# Patient Record
Sex: Female | Born: 1985
Health system: Southern US, Community
[De-identification: ages and names within clinical notes are randomized; demographics above are authoritative.]

## PROBLEM LIST (undated history)

## (undated) DIAGNOSIS — Z8669 Personal history of other diseases of the nervous system and sense organs: Secondary | ICD-10-CM

## (undated) DIAGNOSIS — J45909 Unspecified asthma, uncomplicated: Secondary | ICD-10-CM

## (undated) DIAGNOSIS — L709 Acne, unspecified: Secondary | ICD-10-CM

## (undated) HISTORY — DX: Acne, unspecified: L70.9

## (undated) HISTORY — DX: Unspecified asthma, uncomplicated: J45.909

## (undated) HISTORY — DX: Personal history of other diseases of the nervous system and sense organs: Z86.69

---
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
## 2012-07-15 ENCOUNTER — Ambulatory Visit (INDEPENDENT_AMBULATORY_CARE_PROVIDER_SITE_OTHER): Payer: BC Managed Care – PPO | Admitting: Family Medicine

## 2012-07-15 VITALS — BP 141/88 | HR 84 | Temp 97.5°F | Resp 20 | Ht 62.0 in | Wt 180.0 lb

## 2012-07-15 DIAGNOSIS — J40 Bronchitis, not specified as acute or chronic: Secondary | ICD-10-CM

## 2012-07-15 MED ORDER — BENZONATATE 100 MG PO CAPS: ORAL_CAPSULE | ORAL | Status: DC

## 2012-07-15 MED ORDER — LEVOFLOXACIN 500 MG PO TABS: 500.0000 mg | ORAL_TABLET | Freq: Every day | ORAL | Status: DC

## 2012-07-15 MED ORDER — HYDROCODONE-HOMATROPINE 5-1.5 MG/5ML PO SYRP: 5.0000 mL | ORAL_SOLUTION | ORAL | Status: DC | PRN

## 2012-07-15 NOTE — Progress Notes (Signed)
Subjective: Patient has had a cold and cough for 3 weeks. I will better worse again. She was in [place] but is here with her parents. Has not been running any fevers most of the time, though when they had a 99.5 temperature. She's not coughing up a lot of phlegm. She does get a fair number of these infections.  Objective: This TMs are normal. Throat clear. Neck neck supple without nodes. Chest clear. Heart regular without murmurs. And soft the mass or tenderness.  Assessment bronchitis  Plan: She says Zithromax upsets her stomach. A prescribed Levaquin since she is allergic to penicillins and sulfa.

## 2012-07-15 NOTE — Patient Instructions (Signed)
Drink plenty of fluids. Try to get sufficient rest.  Use medications as directed.  If worse return for a recheck.

## 2013-12-15 ENCOUNTER — Ambulatory Visit (INDEPENDENT_AMBULATORY_CARE_PROVIDER_SITE_OTHER): Payer: BC Managed Care – PPO | Admitting: Emergency Medicine

## 2013-12-15 VITALS — BP 114/82 | HR 96 | Temp 98.1°F | Resp 18 | Ht 62.0 in | Wt 170.0 lb

## 2013-12-15 DIAGNOSIS — J018 Other acute sinusitis: Secondary | ICD-10-CM

## 2013-12-15 DIAGNOSIS — J209 Acute bronchitis, unspecified: Secondary | ICD-10-CM

## 2013-12-15 MED ORDER — PROMETHAZINE-CODEINE 6.25-10 MG/5ML PO SYRP: 5.0000 mL | ORAL_SOLUTION | Freq: Four times a day (QID) | ORAL | Status: DC | PRN

## 2013-12-15 MED ORDER — AMOXICILLIN-POT CLAVULANATE 875-125 MG PO TABS: 1.0000 | ORAL_TABLET | Freq: Two times a day (BID) | ORAL | Status: DC

## 2013-12-15 MED ORDER — PSEUDOEPHEDRINE-GUAIFENESIN ER 60-600 MG PO TB12: 1.0000 | ORAL_TABLET | Freq: Two times a day (BID) | ORAL | Status: DC

## 2013-12-15 NOTE — Patient Instructions (Signed)

## 2013-12-15 NOTE — Progress Notes (Signed)
[facility] [address], [place] [id number] [phone]  Date:  12/15/2013   Name:  Joy Rampnna C Banales   DOB:  [birth year]   MRN:  [id number]  PCP:  Default, Provider, MD    Chief Complaint: Cough, Sore Throat and Nasal Congestion   History of Present Illness:  Joy Boyer is a 28 y.o. very pleasant female patient who presents with the following:  Ill for three weeks.  Has nasal congestion and mucopurulent drainage with a sore throat and post nasal drainage.  Temp to 101.  Has a cough with mucopurulent sputum and occasional wheezing. No stool change no rash or nausea or vomiting.  Symptoms have waxed and waned.  Now worse again.  No ill contacts.  No improvement with over the counter medications or other home remedies. Denies other complaint or health concern today.   There are no active problems to display for this patient.   Past Medical History  Diagnosis Date  . Acne   . Hx of migraines   . Asthma     History reviewed. No pertinent past surgical history.  History  Substance Use Topics  . Smoking status: Never Smoker   . Smokeless tobacco: Not on file  . Alcohol Use: Not on file    Family History  Problem Relation Age of Onset  . Hyperlipidemia Mother   . Hypertension Mother   . Mental illness Father   . Cancer Father   . Diabetes Father   . Heart disease Father   . Cancer Maternal Aunt   . Mental illness Paternal Uncle   . Heart disease Paternal Uncle   . Mental retardation Maternal Grandmother   . COPD Paternal Grandmother   . Cancer Paternal Grandfather     Allergies  Allergen Reactions  . Penicillins Rash    Arms and chest.  . Sulfa Antibiotics Other (See Comments)    Behavior change.  Became agitated and angry.    Medication list has been reviewed and updated.  Current Outpatient Prescriptions on File Prior to Visit  Medication Sig Dispense Refill  . benzonatate (TESSALON) 100 MG capsule Use 1-2 tablets 3 times daily as  necessary for cough. May be used with other cough medicines if needed.  30 capsule  0  . HYDROcodone-homatropine (HYCODAN) 5-1.5 MG/5ML syrup Take 5 mLs by mouth every 4 (four) hours as needed for cough.  120 mL  0  . levofloxacin (LEVAQUIN) 500 MG tablet Take 1 tablet (500 mg total) by mouth daily.  7 tablet  0   No current facility-administered medications on file prior to visit.    Review of Systems:  As per HPI, otherwise negative.    Physical Examination: Filed Vitals:   12/15/13 1414  BP: 114/82  Pulse: 96  Temp: 98.1 F (36.7 C)  Resp: 18   Filed Vitals:   12/15/13 1414  Height: 5\' 2"  (1.575 m)  Weight: 170 lb (77.111 kg)   Body mass index is 31.09 kg/(m^2). Ideal Body Weight: Weight in (lb) to have BMI = 25: 136.4  GEN: WDWN, NAD, Non-toxic, A & O x 3 HEENT: Atraumatic, Normocephalic. Neck supple. No masses, No LAD. Ears and Nose: No external deformity. CV: RRR, No M/G/R. No JVD. No thrill. No extra heart sounds. PULM: CTA B, no wheezes, crackles, rhonchi. No retractions. No resp. distress. No accessory muscle use. ABD: S, NT, ND, +BS. No rebound. No HSM. EXTR: No c/c/e NEURO Normal gait.  PSYCH: Normally  interactive. Conversant. Not depressed or anxious appearing.  Calm demeanor.    Assessment and Plan: Sinusitis Bronchitis mucinex  Phen c cod augmentin  Signed,  Phillips Odor, MD

## 2013-12-19 ENCOUNTER — Telehealth: Payer: Self-pay

## 2013-12-19 NOTE — Telephone Encounter (Signed)
Pt is still not feeling any better from office visit on Saturday and she feels the medication is not helping and would like to know what to do next

## 2013-12-19 NOTE — Telephone Encounter (Signed)
Cough not any better. Cough meds not helping. Nasal congestion is worse. Advised pt to either wait out the abx to see if it turns the corner or if she is getting worse to RTC. Pt agreeable.

## 2013-12-26 ENCOUNTER — Ambulatory Visit (INDEPENDENT_AMBULATORY_CARE_PROVIDER_SITE_OTHER): Payer: BC Managed Care – PPO | Admitting: Family Medicine

## 2013-12-26 VITALS — BP 122/74 | HR 98 | Temp 98.0°F | Resp 17 | Ht 60.0 in | Wt 168.0 lb

## 2013-12-26 DIAGNOSIS — J45909 Unspecified asthma, uncomplicated: Secondary | ICD-10-CM

## 2013-12-26 DIAGNOSIS — J069 Acute upper respiratory infection, unspecified: Secondary | ICD-10-CM

## 2013-12-26 DIAGNOSIS — R059 Cough, unspecified: Secondary | ICD-10-CM

## 2013-12-26 DIAGNOSIS — J40 Bronchitis, not specified as acute or chronic: Secondary | ICD-10-CM

## 2013-12-26 DIAGNOSIS — R05 Cough: Secondary | ICD-10-CM

## 2013-12-26 MED ORDER — FLUTICASONE-SALMETEROL 100-50 MCG/DOSE IN AEPB: 1.0000 | INHALATION_SPRAY | Freq: Two times a day (BID) | RESPIRATORY_TRACT | Status: DC | PRN

## 2013-12-26 MED ORDER — BENZONATATE 100 MG PO CAPS: ORAL_CAPSULE | ORAL | Status: DC

## 2013-12-26 MED ORDER — ALBUTEROL SULFATE HFA 108 (90 BASE) MCG/ACT IN AERS: 2.0000 | INHALATION_SPRAY | Freq: Four times a day (QID) | RESPIRATORY_TRACT | Status: DC | PRN

## 2013-12-26 MED ORDER — HYDROCOD POLST-CHLORPHEN POLST 10-8 MG/5ML PO LQCR: 5.0000 mL | Freq: Two times a day (BID) | ORAL | Status: DC | PRN

## 2013-12-26 NOTE — Progress Notes (Signed)
Chief Complaint:  Chief Complaint  Patient presents with  . Cough  . Chest Pain    Rib     HPI: Joy Boyer is a 28 y.o. female who is here for a 5 week history of persistent cough. She was seen here 2 weeks ago and was put on Augmentin for sinusitis and bronchitis. SHe feels better but cough is persistent. She feels like she deos when she has reactive airway disease and ends up on steroids. She does not feel like that yet but she feels itis almost getting to that point due to the cough. She has it throughout the day and last night she had minimal wheexing. She has a h/o asthm and is allergic to dust and mites but not to pollen. She denies SOB but ay have wheezed a little after coughing spells.  +ashtma but is more consistent with exercise induced asthma now then it used to be when she was younger.  No fevers chills.   Biostatistician  Past Medical History  Diagnosis Date  . Acne   . Hx of migraines   . Asthma    No past surgical history on file. History   Social History  . Marital Status: Single    Spouse Name: N/A    Number of Children: N/A  . Years of Education: N/A   Social History Main Topics  . Smoking status: Never Smoker   . Smokeless tobacco: None  . Alcohol Use: None  . Drug Use: None  . Sexual Activity: No   Other Topics Concern  . None   Social History Narrative  . None   Family History  Problem Relation Age of Onset  . Hyperlipidemia Mother   . Hypertension Mother   . Mental illness Father   . Cancer Father   . Diabetes Father   . Heart disease Father   . Cancer Maternal Aunt   . Mental illness Paternal Uncle   . Heart disease Paternal Uncle   . Mental retardation Maternal Grandmother   . COPD Paternal Grandmother   . Cancer Paternal Grandfather    Allergies  Allergen Reactions  . Penicillins Rash    Arms and chest.  . Sulfa Antibiotics Other (See Comments)    Behavior change.  Became agitated and angry.   Prior to Admission  medications   Medication Sig Start Date End Date Taking? Authorizing Provider  promethazine-codeine (PHENERGAN WITH CODEINE) 6.25-10 MG/5ML syrup Take 5-10 mLs by mouth every 6 (six) hours as needed. 12/15/13  Yes Phillips OdorJeffery Anderson, MD     ROS: The patient denies fevers, chills, night sweats, unintentional weight loss, chest pain, palpitations, dyspnea on exertion, nausea, vomiting, abdominal pain, dysuria, hematuria, melena, numbness, weakness, or tingling.   All other systems have been reviewed and were otherwise negative with the exception of those mentioned in the HPI and as above.    PHYSICAL EXAM: Filed Vitals:   12/26/13 1209  BP: 122/74  Pulse: 98  Temp: 98 F (36.7 C)  Resp: 17  SPo2 100% Filed Vitals:   12/26/13 1209  Height: 5' (1.524 m)  Weight: 168 lb (76.204 kg)   Body mass index is 32.81 kg/(m^2).  General: Alert, no acute distress HEENT:  Normocephalic, atraumatic, oropharynx patent. EOMI, PERRLA. TM nl, no exudates.nontender sinuses, minimal erthem nares Cardiovascular:  Regular rate and rhythm, no rubs murmurs or gallops.  No Carotid bruits, radial pulse intact. No pedal edema.  Respiratory: Clear to auscultation bilaterally.  No wheezes,  rales, or rhonchi.  No cyanosis, no use of accessory musculature GI: No organomegaly, abdomen is soft and non-tender, positive bowel sounds.  No masses. Skin: No rashes. Neurologic: Facial musculature symmetric. Psychiatric: Patient is appropriate throughout our interaction. Lymphatic: No cervical lymphadenopathy Musculoskeletal: Gait intact.   LABS: No results found for this or any previous visit.   EKG/XRAY:   Primary read interpreted [facility].   ASSESSMENT/PLAN: Encounter Diagnoses  Name Primary?  . Cough Yes  . Reactive airway disease without complication   . URI, acute   . Bronchitis, not specified as acute or chronic    Most likely post viral /sinusitis PND cough causing some reactive airway sxs in  patient with h.o asthma She currently has good airflow, deneis any wheecing , SOB/DOE Rx advair, albuterol  Rx Tussionex, tessalon perles. , flonase No  abx for now, see how she does,if no sxs consider f/u   Gross sideeffects, risk and benefits, and alternatives of medications d/w patient. Patient is aware that all medications have potential sideeffects and we are unable to predict every sideeffect or drug-drug interaction that may occur.  Lenell Antuhao P Leota Maka, DO 12/26/2013 5:36 PM

## 2013-12-26 NOTE — Patient Instructions (Signed)
Bronchitis  Bronchitis is inflammation of the airways that extend from the windpipe into the lungs (bronchi). The inflammation often causes mucus to develop, which leads to a cough. If the inflammation becomes severe, it may cause shortness of breath.  CAUSES   Bronchitis may be caused by:    Viral infections.    Bacteria.    Cigarette smoke.    Allergens, pollutants, and other irritants.   SIGNS AND SYMPTOMS   The most common symptom of bronchitis is a frequent cough that produces mucus. Other symptoms include:   Fever.    Body aches.    Chest congestion.    Chills.    Shortness of breath.    Sore throat.   DIAGNOSIS   Bronchitis is usually diagnosed through a medical history and physical exam. Tests, such as chest X-rays, are sometimes done to rule out other conditions.   TREATMENT   You may need to avoid contact with whatever caused the problem (smoking, for example). Medicines are sometimes needed. These may include:   Antibiotics. These may be prescribed if the condition is caused by bacteria.   Cough suppressants. These may be prescribed for relief of cough symptoms.    Inhaled medicines. These may be prescribed to help open your airways and make it easier for you to breathe.    Steroid medicines. These may be prescribed for those with recurrent (chronic) bronchitis.  HOME CARE INSTRUCTIONS   Get plenty of rest.    Drink enough fluids to keep your urine clear or pale yellow (unless you have a medical condition that requires fluid restriction). Increasing fluids may help thin your secretions and will prevent dehydration.    Only take over-the-counter or prescription medicines as directed by your health care provider.   Only take antibiotics as directed. Make sure you finish them even if you start to feel better.   Avoid secondhand smoke, irritating chemicals, and strong fumes. These will make bronchitis worse. If you are a smoker, quit smoking. Consider using nicotine gum or  skin patches to help control withdrawal symptoms. Quitting smoking will help your lungs heal faster.    Put a cool-mist humidifier in your bedroom at night to moisten the air. This may help loosen mucus. Change the water in the humidifier daily. You can also run the hot water in your shower and sit in the bathroom with the door closed for 5 10 minutes.    Follow up with your health care provider as directed.    Wash your hands frequently to avoid catching bronchitis again or spreading an infection to others.   SEEK MEDICAL CARE IF:  Your symptoms do not improve after 1 week of treatment.   SEEK IMMEDIATE MEDICAL CARE IF:   Your fever increases.   You have chills.    You have chest pain.    You have worsening shortness of breath.    You have bloody sputum.   You faint.   You have lightheadedness.   You have a severe headache.    You vomit repeatedly.  MAKE SURE YOU:    Understand these instructions.   Will watch your condition.   Will get help right away if you are not doing well or get worse.  Document Released: 08/23/2005 Document Revised: 06/13/2013 Document Reviewed: 04/17/2013  [employer] Patient Information 2014 [employer].

## 2014-03-07 ENCOUNTER — Ambulatory Visit (INDEPENDENT_AMBULATORY_CARE_PROVIDER_SITE_OTHER): Payer: BC Managed Care – PPO | Admitting: Family Medicine

## 2014-03-07 VITALS — BP 104/76 | HR 76 | Temp 97.6°F | Resp 18 | Ht 62.0 in | Wt 174.2 lb

## 2014-03-07 DIAGNOSIS — H65199 Other acute nonsuppurative otitis media, unspecified ear: Secondary | ICD-10-CM

## 2014-03-07 DIAGNOSIS — H65194 Other acute nonsuppurative otitis media, recurrent, right ear: Secondary | ICD-10-CM

## 2014-03-07 MED ORDER — CEFDINIR 300 MG PO CAPS: 300.0000 mg | ORAL_CAPSULE | Freq: Two times a day (BID) | ORAL | Status: DC

## 2014-03-07 NOTE — Progress Notes (Signed)
   Subjective:    Patient ID: Joy Boyer, female    DOB: 11-12-1985, 28 y.o.   MRN: [id number]  HPI Patient reports right ear pain x 2 days. Has history of OM several months ago that required several antibiotics and ENT referral. She was treated with amoxil, augmentin and finally cefdinir with relief. With last round of antibiotics, she was also put on floxin otic because her TM could not be visualized and ruptured TM could not be excluded. She reports the pain feels the same as her previous OM.  Has history of asthma related to URI, not currently on any maintenance medication, has not needed albuterol for months.   Review of Systems No runny nose, no nasal congestion, no sore throat, no cough, no fever.    Objective:   Physical Exam  Vitals reviewed. Constitutional: She is oriented to person, place, and time. She appears well-developed and well-nourished.  HENT:  Head: Normocephalic and atraumatic.  Right Ear: External ear normal. Right ear drainage: moderate amount wax, few drops of reddish substance, ? cerumen vs. blood although TM intact. Tympanic membrane is injected and erythematous.  Left Ear: Tympanic membrane, external ear and ear canal normal.  Nose: Nose normal.  Mouth/Throat: Oropharynx is clear and moist and mucous membranes are normal.  No mastoid tenderness.   Eyes: Conjunctivae are normal. Right eye exhibits no discharge. Left eye exhibits no discharge. No scleral icterus.  Neck: Normal range of motion. Neck supple.  Musculoskeletal: Normal range of motion.  Neurological: She is alert and oriented to person, place, and time.  Skin: Skin is warm and dry.  Psychiatric: She has a normal mood and affect. Her behavior is normal. Judgment and thought content normal.      Assessment & Plan:  1. 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  Dispense: 20  capsule; Refill: 0 -if no improvement in 48-72 hours, can start floxin otic- she has some at home. -she is going to follow up with ENT  -can take ibuprofen for pain -RTC prn Emi Belfasteborah B. Drea Jurewicz, FNP-BC  [facility]  03/07/2014 3:30 PM

## 2014-05-17 ENCOUNTER — Ambulatory Visit (INDEPENDENT_AMBULATORY_CARE_PROVIDER_SITE_OTHER): Payer: BC Managed Care – PPO | Admitting: Family Medicine

## 2014-05-17 VITALS — BP 108/76 | HR 91 | Temp 97.8°F | Resp 18 | Ht 62.0 in | Wt 174.0 lb

## 2014-05-17 DIAGNOSIS — R300 Dysuria: Secondary | ICD-10-CM

## 2014-05-17 DIAGNOSIS — R109 Unspecified abdominal pain: Secondary | ICD-10-CM

## 2014-05-17 DIAGNOSIS — R1030 Lower abdominal pain, unspecified: Secondary | ICD-10-CM

## 2014-05-17 DIAGNOSIS — R350 Frequency of micturition: Secondary | ICD-10-CM

## 2014-05-17 LAB — POCT UA - MICROSCOPIC ONLY
CASTS, UR, LPF, POC: NEGATIVE
CRYSTALS, UR, HPF, POC: NEGATIVE
Mucus, UA: NEGATIVE
YEAST UA: NEGATIVE

## 2014-05-17 LAB — POCT WET PREP WITH KOH
CLUE CELLS WET PREP PER HPF POC: NEGATIVE
KOH Prep POC: NEGATIVE
RBC Wet Prep HPF POC: NEGATIVE
TRICHOMONAS UA: NEGATIVE
Yeast Wet Prep HPF POC: NEGATIVE

## 2014-05-17 LAB — POCT URINALYSIS DIPSTICK
Bilirubin, UA: NEGATIVE
GLUCOSE UA: NEGATIVE
Ketones, UA: NEGATIVE
Leukocytes, UA: NEGATIVE
NITRITE UA: NEGATIVE
PH UA: 5.5
PROTEIN UA: NEGATIVE
SPEC GRAV UA: 1.015
UROBILINOGEN UA: 0.2

## 2014-05-17 MED ORDER — PHENAZOPYRIDINE HCL 200 MG PO TABS: 200.0000 mg | ORAL_TABLET | Freq: Three times a day (TID) | ORAL | Status: DC | PRN

## 2014-05-17 NOTE — Progress Notes (Signed)
Subjective:    Patient ID: Joy Boyer, female    DOB: [birth year], 28 y.o.   MRN: [id number]  HPI Patient presents today with 2 days of urinary burning, frequency, lower abdominal pain. She has not had frequent cystitis in the past. Last episode several years ago. She is not sexually active.    Review of Systems Low grade fever (99) two days ago, chills today, no nausea/vomiting, some decreased appetite, no back pain. She has some vaginal discharge, but states this is normal for her.     Objective:   Physical Exam  Vitals reviewed. Constitutional: She is oriented to person, place, and time. She appears well-developed and well-nourished.  HENT:  Head: Normocephalic.  Eyes: Conjunctivae are normal.  Neck: Normal range of motion. Neck supple.  Cardiovascular: Normal rate and regular rhythm.   Pulmonary/Chest: Effort normal and breath sounds normal.  Abdominal: Soft. Bowel sounds are normal. There is tenderness in the suprapubic area. There is no CVA tenderness.  Musculoskeletal: Normal range of motion.  Neurological: She is alert and oriented to person, place, and time.  Skin: Skin is warm and dry.  Psychiatric: She has a normal mood and affect. Her behavior is normal. Judgment and thought content normal.   Results for orders placed in visit on 05/17/14  POCT URINALYSIS DIPSTICK      Result Value Ref Range   Color, UA yellow     Clarity, UA clear     Glucose, UA neg     Bilirubin, UA neg     Ketones, UA neg     Spec Grav, UA 1.015     Blood, UA tr-intact     pH, UA 5.5     Protein, UA neg     Urobilinogen, UA 0.2     Nitrite, UA neg     Leukocytes, UA Negative    POCT UA - MICROSCOPIC ONLY      Result Value Ref Range   WBC, Ur, HPF, POC 0-6     RBC, urine, microscopic 0-3     Bacteria, U Microscopic trace     Mucus, UA neg     Epithelial cells, urine per micros 1-5     Crystals, Ur, HPF, POC neg     Casts, Ur, LPF, POC neg     Yeast, UA neg    POCT WET PREP WITH  KOH      Result Value Ref Range   Trichomonas, UA Negative     Clue Cells Wet Prep HPF POC neg     Epithelial Wet Prep HPF POC 4-10     Yeast Wet Prep HPF POC neg     Bacteria Wet Prep HPF POC 2+     RBC Wet Prep HPF POC neg     WBC Wet Prep HPF POC 1-5     KOH Prep POC Negative        Assessment & Plan:  1. Urinary frequency - POCT urinalysis dipstick - POCT UA - Microscopic Only - POCT Wet Prep with KOH  2. Lower abdominal pain - POCT Wet Prep with KOH  3. Dysuria -Provided written and verbal information regarding diagnosis and treatment. -urinalysis and wet prep normal, will treat dysuria and push fluids. - phenazopyridine (PYRIDIUM) 200 MG tablet; Take 1 tablet (200 mg total) by mouth 3 (three) times daily as needed for pain.  Dispense: 10 tablet; Refill: 0 -RTC if no resolution of symptoms in 3-4 days or sooner if worsening.   Gavin Pound  Shea Stakes, FNP-BC  [facility], [facility]  05/19/2014 8:14 PM

## 2014-06-09 ENCOUNTER — Ambulatory Visit (INDEPENDENT_AMBULATORY_CARE_PROVIDER_SITE_OTHER): Payer: BC Managed Care – PPO | Admitting: Internal Medicine

## 2014-06-09 ENCOUNTER — Ambulatory Visit (INDEPENDENT_AMBULATORY_CARE_PROVIDER_SITE_OTHER): Payer: BC Managed Care – PPO

## 2014-06-09 VITALS — BP 128/84 | HR 80 | Temp 97.3°F | Resp 16 | Ht 63.0 in | Wt 175.0 lb

## 2014-06-09 DIAGNOSIS — R300 Dysuria: Secondary | ICD-10-CM

## 2014-06-09 DIAGNOSIS — R1032 Left lower quadrant pain: Secondary | ICD-10-CM

## 2014-06-09 LAB — POCT URINALYSIS DIPSTICK
Blood, UA: NEGATIVE
Glucose, UA: NEGATIVE
KETONES UA: 15
LEUKOCYTES UA: NEGATIVE
NITRITE UA: NEGATIVE
PH UA: 5.5
Spec Grav, UA: >=1.03
Urobilinogen, UA: 0.2

## 2014-06-09 LAB — POCT UA - MICROSCOPIC ONLY
CASTS, UR, LPF, POC: NEGATIVE
Crystals, Ur, HPF, POC: NEGATIVE
Mucus, UA: POSITIVE
WBC, Ur, HPF, POC: NEGATIVE
YEAST UA: NEGATIVE

## 2014-06-09 MED ORDER — CIPROFLOXACIN HCL 250 MG PO TABS: 250.0000 mg | ORAL_TABLET | Freq: Two times a day (BID) | ORAL | Status: DC

## 2014-06-09 NOTE — Progress Notes (Addendum)
Subjective:    Patient ID: Joy Boyer, female    DOB: 03/07/1986, 28 y.o.   MRN: [id number] This chart was scribed for Robert P. Merla Riches, MD by Chestine Spore, ED Scribe. The patient was seen in room 11 at 3:11 PM.   Chief Complaint  Patient presents with  . Dysuria    HPI Joy Boyer is a 28 y.o. female who presents today complaining of dysuria. She states that her symptoms are different from last time that she was seen for it--see our chart hx. She states that she took Keflex for it and after 4 days, it was still present and she was given Rx for Ciprox 3 days with no [facility] She states that she thought that it was a yeast infection so she used Monistat OTC with no relief. She states that she will occassionally get better with Abx. She states that she took Align probiotic this last time with the Abx as well.   She states that she feels cramping in her lower abdomen. She states that she has a burning feeling when she pees and when she doesn't. She states that her period was 2 weeks ago and she is on birth control she states that she did not have different feeling of cramps. She states that in the last three months, she had not had trouble with starting her stream. She states that her urine does not cut off while she is peeing. Has no hx sexual activityShe states that she is having associated symptoms of abdominal pain, vaginal discharge, nausea, and back pain. She denies fever, constipation, diarrhea, nocturia, night sweats, frequency, and any other associated symptoms. She denies ever having an ovarian cyst. She denies being sick in any other way in the last 2-3 months. She states that in te last 6 months-1 year, she had two ear infections, and respiratory issues that were controlled with inhaled steroids.   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 She states that she is not stressed with her new job.    There are no active problems to  display for this patient.  Past Medical History  Diagnosis Date  . Acne   . Hx of migraines   . Asthma    History reviewed. No pertinent past surgical history. Allergies  Allergen Reactions  . Penicillins Rash    Arms and chest.  . Sulfa Antibiotics Other (See Comments)    Behavior change.  Became agitated and angry.   Prior to Admission medications   Medication Sig Start Date End Date Taking? Authorizing Provider  albuterol (PROVENTIL HFA;VENTOLIN HFA) 108 (90 BASE) MCG/ACT inhaler Inhale 2 puffs into the lungs every 6 (six) hours as needed for wheezing or shortness of breath. 12/26/13  Yes Thao P Le, DO  norethindrone-ethinyl estradiol (OVCON-35,BALZIVA,BRIELLYN) 0.4-35 MG-MCG tablet Take 1 tablet by mouth daily.   Yes Historical Provider, MD  phenazopyridine (PYRIDIUM) 200 MG tablet Take 1 tablet (200 mg total) by mouth 3 (three) times daily as needed for pain. 05/17/14   Emi Belfast, FNP      Review of Systems  Constitutional: Positive for diaphoresis. Negative for fever and chills.  HENT: Negative for dental problem.   Gastrointestinal: Positive for abdominal pain. Negative for diarrhea and constipation.  Genitourinary: Positive for dysuria and vaginal discharge. Negative for frequency.       No nocturia  Musculoskeletal: Positive for back pain.  Neurological: Negative for headaches.  Objective:   Physical Exam  Nursing note and vitals reviewed. Constitutional: She is oriented to person, place, and time. She appears well-developed and well-nourished. No distress.  HENT:  Head: Normocephalic and atraumatic.  Eyes: EOM are normal.  Neck: Neck supple. No tracheal deviation present.  Cardiovascular: Normal rate.   Pulmonary/Chest: Effort normal. No respiratory distress.  Abdominal: Bowel sounds are normal. She exhibits no distension and no mass. There is tenderness (LLQ to palpation). There is no rebound and no guarding.  No CVA tenderness to percussion    Musculoskeletal: Normal range of motion.  Neurological: She is alert and oriented to person, place, and time.  Skin: Skin is warm and dry.  Psychiatric: She has a normal mood and affect. Her behavior is normal.   [facility] reading (PRIMARY) by  Dr. Doolitt;le=NAD but lots of stool  Results for orders placed in visit on 06/09/14  POCT URINALYSIS DIPSTICK      Result Value Ref Range   Color, UA dark yellow     Clarity, UA clear     Glucose, UA neg     Bilirubin, UA small     Ketones, UA 15     Spec Grav, UA >=1.030     Blood, UA neg     pH, UA 5.5     Protein, UA trace     Urobilinogen, UA 0.2     Nitrite, UA neg     Leukocytes, UA Negative    POCT UA - MICROSCOPIC ONLY      Result Value Ref Range   WBC, Ur, HPF, POC neg     RBC, urine, microscopic 0-2     Bacteria, U Microscopic trace     Mucus, UA pos     Epithelial cells, urine per micros 5-7     Crystals, Ur, HPF, POC neg     Casts, Ur, LPF, POC neg     Yeast, UA neg           BP 128/84  Pulse 80  Temp(Src) 97.3 F (36.3 C) (Oral)  Resp 16  Ht 5\' 3"  (1.6 m)  Wt 175 lb (79.379 kg)  BMI 31.01 kg/m2  SpO2 100%  LMP 05/28/2014  Assessment & Plan:  COORDINATION OF CARE: 3:12 PM-Discussed treatment plan which includes miralax and cipro with pt at bedside and pt agreed to plan.   I personally performed the services described in this documentation, which was scribed in my presence. The recorded information has been reviewed and is accurate.    Dysuria - Plan: POCT urinalysis dipstick, POCT UA - Microscopic Only, Urine culture  Abdominal pain, LLQ - Plan: DG Abd 1 View  Unclear dx--possible relapse of incompletely treated UTI but past labs did not include a culture Cramping could be GI so will start miralax. Dysuria without freq and no local skin lesions=?? Will culture urine and start cipro with change in treatment dictated by labs and course

## 2014-06-09 NOTE — Patient Instructions (Signed)
miralax

## 2014-06-11 LAB — URINE CULTURE
Colony Count: NO GROWTH
ORGANISM ID, BACTERIA: NO GROWTH

## 2014-06-15 ENCOUNTER — Telehealth: Payer: Self-pay | Admitting: Internal Medicine

## 2014-06-15 NOTE — Telephone Encounter (Signed)
Disc neg cult--she's better, not well If 10d rx does not clear this then urol eval next

## 2014-09-30 ENCOUNTER — Ambulatory Visit (INDEPENDENT_AMBULATORY_CARE_PROVIDER_SITE_OTHER): Payer: BLUE CROSS/BLUE SHIELD | Admitting: Family Medicine

## 2014-09-30 VITALS — BP 124/88 | HR 121 | Temp 98.4°F | Resp 20 | Ht 62.0 in | Wt 179.0 lb

## 2014-09-30 DIAGNOSIS — J45909 Unspecified asthma, uncomplicated: Secondary | ICD-10-CM

## 2014-09-30 DIAGNOSIS — J209 Acute bronchitis, unspecified: Secondary | ICD-10-CM

## 2014-09-30 MED ORDER — HYDROCOD POLST-CHLORPHEN POLST 10-8 MG/5ML PO LQCR: 5.0000 mL | Freq: Two times a day (BID) | ORAL | Status: DC | PRN

## 2014-09-30 MED ORDER — PREDNISONE 20 MG PO TABS: ORAL_TABLET | ORAL | Status: DC

## 2014-09-30 MED ORDER — LEVOFLOXACIN 500 MG PO TABS: 500.0000 mg | ORAL_TABLET | Freq: Every day | ORAL | Status: DC

## 2014-09-30 MED ORDER — ALBUTEROL SULFATE HFA 108 (90 BASE) MCG/ACT IN AERS: 2.0000 | INHALATION_SPRAY | Freq: Four times a day (QID) | RESPIRATORY_TRACT | Status: DC | PRN

## 2014-09-30 NOTE — Progress Notes (Signed)
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 Over the weekend she developed a fever and cough became worse. She's not having shortness of breath but the cough is keeping her awake.  Patient's had no hemoptysis, extreme shortness of breath or chest pain  Objective: HEENT: Unremarkable Neck: Supple no adenopathy Chest: Clear with a fe intermittent rhonchi Heart: Regular no murmur Extremities: No rash or edema     ICD-9-CM ICD-10-CM   1. Acute bronchitis, unspecified organism 466.0 J20.9 levofloxacin (LEVAQUIN) 500 MG tablet     predniSONE (DELTASONE) 20 MG tablet     chlorpheniramine-HYDROcodone (TUSSIONEX PENNKINETIC ER) 10-8 MG/5ML LQCR  2. Reactive airway disease without complication 493.90 J45.909 albuterol (PROVENTIL HFA;VENTOLIN HFA) 108 (90 BASE) MCG/ACT inhaler     levofloxacin (LEVAQUIN) 500 MG tablet     predniSONE (DELTASONE) 20 MG tablet     chlorpheniramine-HYDROcodone (TUSSIONEX PENNKINETIC ER) 10-8 MG/5ML LQCR     Signed, Elvina SidleKurt Rayaan Garguilo, MD

## 2014-09-30 NOTE — Patient Instructions (Signed)

## 2015-03-15 ENCOUNTER — Ambulatory Visit (INDEPENDENT_AMBULATORY_CARE_PROVIDER_SITE_OTHER): Payer: BLUE CROSS/BLUE SHIELD | Admitting: Family Medicine

## 2015-03-15 VITALS — BP 128/64 | HR 96 | Temp 97.6°F | Resp 16 | Ht 62.0 in | Wt 189.8 lb

## 2015-03-15 DIAGNOSIS — H9202 Otalgia, left ear: Secondary | ICD-10-CM

## 2015-03-15 DIAGNOSIS — H60392 Other infective otitis externa, left ear: Secondary | ICD-10-CM

## 2015-03-15 MED ORDER — NEOMYCIN-POLYMYXIN-HC 3.5-10000-1 OT SOLN: 3.0000 [drp] | Freq: Four times a day (QID) | OTIC | Status: DC

## 2015-03-15 NOTE — Progress Notes (Signed)
[facility] [address], [place] [id number] [phone]  Date:  03/15/2015   Name:  Joy Boyer   DOB:  Nov 08, 1985   MRN:  [id number]  PCP:  Default, Provider, MD    Chief Complaint: Ear Pain   History of Present Illness:  Joy Goltznna Stcyr is a 29 y.o. very pleasant female patient who presents with the following:  Generally healthy established pt here today with left ear pain- the right is a little unusual feeling She has had a few ear infection recetnly so she came in early.   She noted the discomfort yesterday and last night No other sx except for baseline allergies which are normal for her She had a lot of ear infection as a child, never had tubes.  However her ears did ok until more recently  She has a little bit of heraring loss in the left only, no tinnitus.  No drainage from her ears    There are no active problems to display for this patient.   Past Medical History  Diagnosis Date  . Acne   . Hx of migraines   . Asthma     History reviewed. No pertinent past surgical history.  History  Substance Use Topics  . Smoking status: Never Smoker   . Smokeless tobacco: Not on file  . Alcohol Use: Not on file    Family History  Problem Relation Age of Onset  . Hyperlipidemia Mother   . Hypertension Mother   . Mental illness Father   . Cancer Father   . Diabetes Father   . Heart disease Father   . Cancer Maternal Aunt   . Mental illness Paternal Uncle   . Heart disease Paternal Uncle   . Mental retardation Maternal Grandmother   . COPD Paternal Grandmother   . Cancer Paternal Grandfather     Allergies  Allergen Reactions  . Penicillins Rash    Arms and chest.  . Sulfa Antibiotics Other (See Comments)    Behavior change.  Became agitated and angry.    Medication list has been reviewed and updated.  Current Outpatient Prescriptions on File Prior to Visit  Medication Sig Dispense Refill  . albuterol (PROVENTIL HFA;VENTOLIN HFA)  108 (90 BASE) MCG/ACT inhaler Inhale 2 puffs into the lungs every 6 (six) hours as needed for wheezing or shortness of breath. 1 Inhaler 3  . norethindrone-ethinyl estradiol (OVCON-35,BALZIVA,BRIELLYN) 0.4-35 MG-MCG tablet Take 1 tablet by mouth daily.     No current facility-administered medications on file prior to visit.    Review of Systems:  As per HPI- otherwise negative.   Physical Examination: Filed Vitals:   03/15/15 1024  BP: 128/64  Pulse: 117  Temp: 97.6 F (36.4 C)  Resp: 16   Filed Vitals:   03/15/15 1024  Height: 5\' 2"  (1.575 m)  Weight: 189 lb 12.8 oz (86.093 kg)   Body mass index is 34.71 kg/(m^2). Ideal Body Weight: Weight in (lb) to have BMI = 25: 136.4  GEN: WDWN, NAD, Non-toxic, A & O x , overweight, looks well HEENT: Atraumatic, Normocephalic. Neck supple. No masses, No LAD.  Bilateral TM wnl, oropharynx normal.  PEERL,EOMI.   Left external canal with erythema and slight debris, no swelling.  Tenderness with movement of pinna Ears and Nose: No external deformity. CV: RRR, No M/G/R. No JVD. No thrill. No extra heart sounds. PULM: CTA B, no wheezes, crackles, rhonchi. No retractions. No resp. distress. No accessory muscle use. EXTR: No  c/c/e NEURO Normal gait.  PSYCH: Normally interactive. Conversant. Not depressed or anxious appearing.  Calm demeanor.    Assessment and Plan: Otitis, externa, infective, left - Plan: neomycin-polymyxin-hydrocortisone (CORTISPORIN) otic solution  Ear pain, left  Treat as above for OE.  Follow-up of not better soon  Signed Abbe Amsterdam, MD

## 2015-03-15 NOTE — Patient Instructions (Signed)
You have otitis externa, or an ear canal infection Use the drop 4x a day for 7- 10 days Certainly also ok to use ibuprofen and/ or tylenol as needed Let me know if not better in the next couple of days!

## 2015-03-16 ENCOUNTER — Telehealth: Payer: Self-pay

## 2015-03-16 NOTE — Telephone Encounter (Signed)
Patient calling back to see if an antibiotic will be called in today. I informed her Dr Patsy Lageropland was not in the office. She stated she was in a lot of discomfort and needed something today. Patients call back [phone] and she uses [facility] at [address]

## 2015-03-16 NOTE — Telephone Encounter (Signed)
Patient states her left ear is not any better. Per she was told by Dr copland that she would call in oral antibiotic if her ear didn't improve. [facility] [address] and her call back number is [phone]

## 2015-03-17 ENCOUNTER — Other Ambulatory Visit: Payer: Self-pay | Admitting: Emergency Medicine

## 2015-03-17 MED ORDER — CLINDAMYCIN HCL 150 MG PO CAPS: 150.0000 mg | ORAL_CAPSULE | Freq: Three times a day (TID) | ORAL | Status: DC

## 2015-03-17 NOTE — Telephone Encounter (Signed)
Spoke to Dr. Dareen PianoAnderson and he sent in Clindamycin for pt. Informed pt abx has been sent in.

## 2015-07-13 ENCOUNTER — Ambulatory Visit (INDEPENDENT_AMBULATORY_CARE_PROVIDER_SITE_OTHER): Payer: BLUE CROSS/BLUE SHIELD | Admitting: Physician Assistant

## 2015-07-13 VITALS — BP 140/80 | HR 101 | Temp 97.7°F | Resp 18 | Ht 62.0 in | Wt 196.4 lb

## 2015-07-13 DIAGNOSIS — H6091 Unspecified otitis externa, right ear: Secondary | ICD-10-CM | POA: Diagnosis not present

## 2015-07-13 DIAGNOSIS — M26609 Unspecified temporomandibular joint disorder, unspecified side: Secondary | ICD-10-CM

## 2015-07-13 MED ORDER — CLINDAMYCIN HCL 150 MG PO CAPS: 150.0000 mg | ORAL_CAPSULE | Freq: Three times a day (TID) | ORAL | Status: DC

## 2015-07-13 MED ORDER — MELOXICAM 15 MG PO TABS: 15.0000 mg | ORAL_TABLET | Freq: Every day | ORAL | Status: DC

## 2015-07-13 MED ORDER — CIPROFLOXACIN-HYDROCORTISONE 0.2-1 % OT SUSP: 3.0000 [drp] | Freq: Two times a day (BID) | OTIC | Status: DC

## 2015-07-13 NOTE — Patient Instructions (Signed)
Resume the heating pad, soft foods, etc. If your symptoms persist, please follow-up with ENT.

## 2015-07-13 NOTE — Progress Notes (Signed)
Patient ID: Joy Boyer, female    DOB: 01/27/1986, 29 y.o.   MRN: [id number]  PCP: Pcp Not In System  Subjective:   Chief Complaint  Patient presents with  . OTHER    Poss tmj, 10 days ago    HPI Presents for evaluation of jaw pain x 10 days.   Saw ENT in July after multiple ear infections primarily on the right side, at which time a diagnosis of TMJ was made. Tried a 2.5-week trial of high-dose ibuprofen, soft foods, and heat, which did help until now.   Jaw pain has been worse on the right side over the last week, but yesterday it was particularly painful. Associated with mild swelling and difficulty eating any type of food. She took 2 ibuprofen q 4 hours with only mild temporary relief and nausea.   Patient denies any clicking of popping of the jaw. The right ear is tender all over, but she denies any discharge, fullness, or tinnitus. She endorses slightly muffled hearing, but attributes this to her jaw swelling.   Review of Systems Constitutional: Negative for fever and chills.  HENT: Positive for ear pain (right ear tenderness) and hearing loss (slightly muffled hearing on the right side). Negative for congestion, ear discharge, postnasal drip, rhinorrhea, sinus pressure, sore throat and tinnitus.  Gastrointestinal: Positive for nausea (related to ibuprofen consumption). Negative for vomiting.  Musculoskeletal: Positive for joint swelling (right jaw swelling) and arthralgias (right jaw pain).      Patient Active Problem List   Diagnosis Date Noted  . 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 Authorizing Provider  albuterol (PROVENTIL HFA;VENTOLIN HFA) 108 (90 BASE) MCG/ACT inhaler Inhale 2 puffs into the lungs every 6 (six) hours as needed for wheezing or shortness of breath. 09/30/14  Yes Elvina SidleKurt Lauenstein, MD  loratadine (CLARITIN) 10 MG tablet Take 10 mg by mouth daily.   Yes Historical Provider, MD    norethindrone-ethinyl estradiol (OVCON-35,BALZIVA,BRIELLYN) 0.4-35 MG-MCG tablet Take 1 tablet by mouth daily.   Yes Historical Provider, MD     Allergies  Allergen Reactions  . Penicillins Rash    Arms and chest.  . Sulfa Antibiotics Other (See Comments)    Behavior change.  Became agitated and angry.       Objective:  Physical Exam  Constitutional: She is oriented to person, place, and time. She appears well-developed and well-nourished. She is active and cooperative. No distress.  BP 140/80 mmHg  Pulse 101  Temp(Src) 97.7 F (36.5 C) (Oral)  Resp 18  Ht 5\' 2"  (1.575 m)  Wt 196 lb 6.4 oz (89.086 kg)  BMI 35.91 kg/m2  SpO2 99%  LMP 07/04/2015   HENT:  Head: Normocephalic and atraumatic.  Right Ear: There is swelling and tenderness.  Left Ear: Hearing, tympanic membrane, external ear and ear canal normal.  Nose: Nose normal.  Mouth/Throat: Uvula is midline, oropharynx is clear and moist and mucous membranes are normal. Normal dentition. No uvula swelling.  RIGHT external ear is swollen, and mildly erythematous. It is tender on palpation. The canal is swollen with moist, soft cerumen and visualization of the TM is obscured. Unable to open her mouth wide enough to insert a 3-fingered fist due to pain. Tenderness of the RIGHT TMJ.  Eyes: Conjunctivae are normal.  Neck: Normal range of motion, full passive range of motion without pain and phonation normal. Neck supple.  Pulmonary/Chest: Effort normal.  Neurological:  She is alert and oriented to person, place, and time.  Psychiatric: She has a normal mood and affect. Her speech is normal and behavior is normal.           Assessment & Plan:   1. TMJ dysfunction Resume regimen of soft foods and heat application. Meloxicam instead of ibuprofen. - meloxicam (MOBIC) 15 MG tablet; Take 1 tablet (15 mg total) by mouth daily.  Dispense: 30 tablet; Refill: 0  2. Otitis externa, right She reports that she doesn't usually get  better with just the drops, so I agree to provde an oral agent as well.  - ciprofloxacin-hydrocortisone (CIPRO HC OTIC) otic suspension; Place 3 drops into the right ear 2 (two) times daily.  Dispense: 10 mL; Refill: 0 - clindamycin (CLEOCIN) 150 MG capsule; Take 1 capsule (150 mg total) by mouth 3 (three) times daily.  Dispense: 30 capsule; Refill: 0  If symptoms worsen/persist, RTC or follow-up with ENT.  Fernande Bras, PA-C Physician Assistant-Certified [facility]

## 2015-07-13 NOTE — Progress Notes (Signed)
Subjective:    Patient ID: Joy Boyer, female    DOB: Dec 18, 1985, 29 y.o.   MRN: [id number]  Chief Complaint  Patient presents with  . OTHER    Poss tmj, 10 days ago   HPI Patient presents today for evaluation of jaw pain x 10 days.   Saw ENT in July after multiple ear infections primarily on the right side, at which time a diagnosis of TMJ was made. Tried a 2.5-week trial of high-dose ibuprofen, soft foods, and heat, which did help for a while, but her jaw pain has since returned.   Jaw pain has been worse on the right side over the last week, but yesterday it was particularly painful. Associated with mild swelling and difficulty eating any type of food. She took 2 ibuprofen q 4 hours with only mild temporary relief and nausea.   Patient denies any clicking of popping of the jaw. The right ear is tender all over, but she denies any discharge, fullness, or tinnitus. She endorses slightly muffled hearing, but attributes this to her jaw swelling.  No other concerns on today's visit.   Review of Systems  Constitutional: Negative for fever and chills.  HENT: Positive for ear pain (right ear tenderness) and hearing loss (slightly muffled hearing on the right side). Negative for congestion, ear discharge, postnasal drip, rhinorrhea, sinus pressure, sore throat and tinnitus.   Gastrointestinal: Positive for nausea (related to ibuprofen consumption). Negative for vomiting.  Musculoskeletal: Positive for joint swelling (right jaw swelling) and arthralgias (right jaw pain).     Patient Active Problem List   Diagnosis Date Noted  . 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 Hyperlipidemia Mother   . Hypertension Mother   . Mental illness Father   . Cancer Father   . Diabetes Father   . Heart disease Father   . Cancer Maternal Aunt   . Mental illness Paternal Uncle   . Heart disease Paternal Uncle   . Mental retardation Maternal Grandmother   .  COPD Paternal Grandmother   . Cancer Paternal Grandfather    Social History   Social History  . Marital Status: Single    Spouse Name: N/A  . Number of Children: N/A  . Years of Education: N/A   Occupational History  . Not on file.   Social History Main Topics  . Smoking status: Never Smoker   . Smokeless tobacco: Not on file  . Alcohol Use: Not on file  . Drug Use: Not on file  . Sexual Activity: No   Other Topics Concern  . 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    Prior to Admission medications   Medication Sig Start Date End Date Taking? Authorizing Provider  albuterol (PROVENTIL HFA;VENTOLIN HFA) 108 (90 BASE) MCG/ACT inhaler Inhale 2 puffs into the lungs every 6 (six) hours as needed for wheezing or shortness of breath. 09/30/14  Yes Elvina Sidle, MD  loratadine (CLARITIN) 10 MG tablet Take 10 mg by mouth daily.   Yes Historical Provider, MD  norethindrone-ethinyl estradiol (OVCON-35,BALZIVA,BRIELLYN) 0.4-35 MG-MCG tablet Take 1 tablet by mouth daily.   Yes Historical Provider, MD   Allergies  Allergen Reactions  . Penicillins Rash    Arms and chest. Has tolerated amoxicillin and Augmentin many times since then without difficulty.  . Sulfa Antibiotics Other (See Comments)    Behavior change.  Became  agitated and angry.      Objective:   Physical Exam  Constitutional: She is oriented to person, place, and time. She appears well-developed and well-nourished. No distress.  BP 140/80 mmHg  Pulse 101  Temp(Src) 97.7 F (36.5 C) (Oral)  Resp 18  Ht 5\' 2"  (1.575 m)  Wt 196 lb 6.4 oz (89.086 kg)  BMI 35.91 kg/m2  SpO2 99%  LMP 07/04/2015  HENT:  Head: Normocephalic and atraumatic.  Left Ear: External ear normal.  Nose: Nose normal.  Mouth/Throat: Oropharynx is clear and moist. No oropharyngeal exudate.  Right external ear exhibits diffuse mild swelling. No erythema or discharge. Moderate  fluid inside right ear canal. Right posterior and external ear tender to palpation. Right jaw tender to palpation. No clicking or popping appreciated. Patient unable to open jaw to 3 knuckles.   Eyes: EOM are normal. No scleral icterus.  Neck: Neck supple.  Cardiovascular: Normal rate, regular rhythm and normal heart sounds.  Exam reveals no gallop and no friction rub.   No murmur heard. Pulmonary/Chest: Effort normal and breath sounds normal. No respiratory distress. She has no wheezes. She has no rales.  Lymphadenopathy:    She has no cervical adenopathy.  Neurological: She is alert and oriented to person, place, and time.  Skin: Skin is warm and dry. She is not diaphoretic. No erythema.  Psychiatric: She has a normal mood and affect. Her behavior is normal. Judgment and thought content normal.      Assessment & Plan:  1. TMJ dysfunction - Re-initiate 2 weeks of soft foods only, NSAID therapy, and heating pad. If symptoms persist after 2.5 weeks, see ENT. Can return to clinic if ENT unable to see patient in a timely manner.  - meloxicam (MOBIC) 15 MG tablet; Take 1 tablet (15 mg total) by mouth daily.  Dispense: 30 tablet; Refill: 0  2. Otitis externa, right - Ear infection likely exacerbating TMJ dysfunction.  - ciprofloxacin-hydrocortisone (CIPRO HC OTIC) otic suspension; Place 3 drops into the right ear 2 (two) times daily.  Dispense: 10 mL; Refill: 0 - clindamycin (CLEOCIN) 150 MG capsule; Take 1 capsule (150 mg total) by mouth 3 (three) times daily.  Dispense: 30 capsule; Refill: 0

## 2015-07-15 ENCOUNTER — Telehealth: Payer: Self-pay

## 2015-07-15 NOTE — Telephone Encounter (Signed)
I want her to have a quinolone-steroid ear drop. Ciprodex 0.3% 4 gtt in ear BID x 7 days also requires prior authorization and they aren't given a preferred alternative.  Asked that they initiate the prior authorization.  Patient has sulfa allergy, so shouldn't use cortisporin otic.

## 2015-07-15 NOTE — Telephone Encounter (Signed)
Pharm faxed notice that ins does not cover the Cipro HC otic sol. I tried to call [employer] twice on different numbers and the reps could not give me names of specific alternatives. It appears from [facility] [place] online info that they may cover the ofloxacin, neomycin-polymyxin-hc, antipyrine-benzocaine otic solutions. Chelle, do you want to send in a Rx for one of these?

## 2015-07-17 NOTE — Telephone Encounter (Signed)
PA started

## 2016-09-13 ENCOUNTER — Ambulatory Visit (INDEPENDENT_AMBULATORY_CARE_PROVIDER_SITE_OTHER): Payer: BLUE CROSS/BLUE SHIELD | Admitting: Physician Assistant

## 2016-09-13 ENCOUNTER — Encounter: Payer: Self-pay | Admitting: Physician Assistant

## 2016-09-13 VITALS — BP 124/80 | HR 103 | Temp 97.6°F | Resp 18 | Ht 62.0 in | Wt 205.0 lb

## 2016-09-13 DIAGNOSIS — R05 Cough: Secondary | ICD-10-CM

## 2016-09-13 DIAGNOSIS — R059 Cough, unspecified: Secondary | ICD-10-CM

## 2016-09-13 DIAGNOSIS — J209 Acute bronchitis, unspecified: Secondary | ICD-10-CM

## 2016-09-13 MED ORDER — HYDROCOD POLST-CPM POLST ER 10-8 MG/5ML PO SUER: 5.0000 mL | Freq: Two times a day (BID) | ORAL | 0 refills | Status: DC | PRN

## 2016-09-13 MED ORDER — PREDNISONE 20 MG PO TABS: ORAL_TABLET | ORAL | 0 refills | Status: DC

## 2016-09-13 MED ORDER — CLARITHROMYCIN ER 500 MG PO TB24: 1000.0000 mg | ORAL_TABLET | Freq: Every day | ORAL | 0 refills | Status: AC

## 2016-09-13 NOTE — Progress Notes (Signed)
Joy Boyer  MRN: [id number] DOB: 01/23/1986  PCP: Pcp Not In System  Subjective:  Joy Boyer is a 31 year old female who presents to clinic for cough and sore throat. She had a virus about one month ago, cough has persisted since that time. Cough comes and goes, however never completely went away.   Has tried Nasocort and advil. Not helping.  History of asthma - Has been using albuterol 1-2 times a day.  Denies fever, chills, chest pain, chest congestion, abdominal pain, headache, body aches, SOB, wheezing.   Review of Systems  Constitutional: Negative for chills, diaphoresis, fatigue and fever.  HENT: Negative for congestion, postnasal drip, rhinorrhea, sinus pressure, sneezing and sore throat.   Respiratory: Positive for cough, chest tightness and shortness of breath. Negative for wheezing.   Cardiovascular: Negative for chest pain and palpitations.  Gastrointestinal: Negative for abdominal pain, diarrhea, nausea and vomiting.  Neurological: Negative for weakness, light-headedness and headaches.  Psychiatric/Behavioral: Negative for sleep disturbance.    Patient Active Problem List   Diagnosis Date Noted  . 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 albuterol (PROVENTIL HFA;VENTOLIN HFA) 108 (90 BASE) MCG/ACT inhaler Inhale 2 puffs into the lungs every 6 (six) hours as needed for wheezing or shortness of breath. 1 Inhaler 3  . loratadine (CLARITIN) 10 MG tablet Take 10 mg by mouth daily.    . norethindrone-ethinyl estradiol (OVCON-35,BALZIVA,BRIELLYN) 0.4-35 MG-MCG tablet Take 1 tablet by mouth daily.    . ciprofloxacin-hydrocortisone (CIPRO HC OTIC) otic suspension Place 3 drops into the right ear 2 (two) times daily. (Patient not taking: Reported on 09/13/2016) 10 mL 0  . clindamycin (CLEOCIN) 150 MG capsule Take 1 capsule (150 mg total) by mouth 3 (three) times daily. (Patient not taking: Reported on  09/13/2016) 30 capsule 0  . meloxicam (MOBIC) 15 MG tablet Take 1 tablet (15 mg total) by mouth daily. (Patient not taking: Reported on 09/13/2016) 30 tablet 0   No current facility-administered medications on file prior to visit.     Allergies  Allergen Reactions  . Penicillins Rash    Arms and chest. Has tolerated amoxicillin and Augmentin many times since then without difficulty.  . Sulfa Antibiotics Other (See Comments)    Behavior change.  Became agitated and angry.     Objective:  BP 124/80   Pulse (!) 103   Temp 97.6 F (36.4 C) (Oral)   Resp 18   Ht 5\' 2"  (1.575 m)   Wt 205 lb (93 kg)   LMP 08/25/2016   SpO2 97%   BMI 37.49 kg/m   Physical Exam  Constitutional: She is oriented to person, place, and time and well-developed, well-nourished, and in no distress. No distress.  HENT:  Right Ear: Tympanic membrane normal.  Left Ear: Tympanic membrane normal.  Nose: Mucosal edema present. No rhinorrhea. Right sinus exhibits no maxillary sinus tenderness and no frontal sinus tenderness. Left sinus exhibits no maxillary sinus tenderness and no frontal sinus tenderness.  Mouth/Throat: Mucous membranes are normal. Posterior oropharyngeal edema present. No oropharyngeal exudate or posterior oropharyngeal erythema.  Cardiovascular: Normal rate, regular rhythm and normal heart sounds.   Neurological: She is alert and oriented to person, place, and time. GCS score is 15.  Skin: Skin is warm and dry.  Psychiatric: Mood, memory, affect and judgment normal.  Vitals reviewed.   Assessment and Plan :  1. Cough 2. Acute bronchitis, unspecified organism -  chlorpheniramine-HYDROcodone (TUSSIONEX PENNKINETIC ER) 10-8 MG/5ML SUER; Take 5 mLs by mouth every 12 (twelve) hours as needed for cough.  Dispense: 100 mL; Refill: 0 - predniSONE (DELTASONE) 20 MG tablet; Take 3 PO QAM x3days, 2 PO QAM x3days, 1 PO QAM x3days  Dispense: 18 tablet; Refill: 0 - clarithromycin (BIAXIN XL) 500 MG 24 hr  tablet; Take 2 tablets (1,000 mg total) by mouth daily.  Dispense: 10 tablet; Refill: 0 - Advised Joy Boyer to take steroid taper. If she is still not feeling better, she may take antibiotic. RTC if no improvement after treatment. Supportive care: Push fluids, flonase.    Marco Collie, PA-C  [facility] 09/13/2016 10:26 AM

## 2016-09-13 NOTE — Patient Instructions (Addendum)
Take your steroid pack first. If you are not feeling better after this, start your antibiotic. Please stay well hydrated - drink at least 2 liters of water a day. Return to clinic if you are not better after treatment.   Thank you for coming in today. I hope you feel we met your needs.  Feel free to call [facility] if you have any questions or further requests.  Please consider signing up for MyChart if you do not already have it, as this is a great way to communicate with me.  Best,  Whitney McVey, PA-C   IF you received an x-ray today, you will receive an invoice from [facility]. Please contact [facility] at [phone] with questions or concerns regarding your invoice.   IF you received labwork today, you will receive an invoice from [facility]. Please contact [facility] at [phone] with questions or concerns regarding your invoice.   Our billing staff will not be able to assist you with questions regarding bills from these companies.  You will be contacted with the lab results as soon as they are available. The fastest way to get your results is to activate your My Chart account. Instructions are located on the last page of this paperwork. If you have not heard from [place] regarding the results in 2 weeks, please contact this office.

## 2019-11-09 ENCOUNTER — Ambulatory Visit: Payer: Self-pay | Attending: Internal Medicine

## 2019-11-09 DIAGNOSIS — Z23 Encounter for immunization: Secondary | ICD-10-CM | POA: Insufficient documentation

## 2019-11-09 NOTE — Progress Notes (Signed)
   Covid-19 Vaccination Clinic  Name:  Joy Boyer    MRN: [id number] DOB: [birth year]  11/09/2019  Ms. Key was observed post Covid-19 immunization for 15 minutes without incident. She was provided with Vaccine Information Sheet and instruction to access the V-Safe system.   [name] was instructed to call 911 with any severe reactions post vaccine: Marland Kitchen Difficulty breathing  . Swelling of face and throat  . A fast heartbeat  . A bad rash all over body  . Dizziness and weakness

## 2019-12-01 ENCOUNTER — Ambulatory Visit: Payer: Self-pay | Attending: Internal Medicine

## 2019-12-01 DIAGNOSIS — Z23 Encounter for immunization: Secondary | ICD-10-CM

## 2019-12-01 NOTE — Progress Notes (Signed)
   Covid-19 Vaccination Clinic  Name:  Sherrilyn Nairn    MRN: [id number] DOB: [birth year]  12/01/2019  Ms. Jergens was observed post Covid-19 immunization for 15 minutes without incident. She was provided with Vaccine Information Sheet and instruction to access the V-Safe system.   Ms. Libman was instructed to call 911 with any severe reactions post vaccine: Marland Kitchen Difficulty breathing  . Swelling of face and throat  . A fast heartbeat  . A bad rash all over body  . Dizziness and weakness   Immunizations Administered    Name Date Dose VIS Date Route   Pfizer COVID-19 Vaccine 12/01/2019 10:12 AM 0.3 mL 08/17/2019 Intramuscular   Manufacturer: [employer], [employer]   Lot: [id number]   NDC: [address]-8

## 2019-12-10 ENCOUNTER — Ambulatory Visit: Payer: Self-pay

## 2020-07-12 ENCOUNTER — Encounter (HOSPITAL_COMMUNITY): Payer: Self-pay | Admitting: Emergency Medicine

## 2020-07-12 ENCOUNTER — Other Ambulatory Visit: Payer: Self-pay

## 2020-07-12 ENCOUNTER — Inpatient Hospital Stay (HOSPITAL_COMMUNITY)
Admission: EM | Admit: 2020-07-12 | Discharge: 2020-07-16 | DRG: 175 | Disposition: A | Discharge status: Home / Self Care | Payer: PRIVATE HEALTH INSURANCE | Attending: Internal Medicine | Admitting: Internal Medicine

## 2020-07-12 DIAGNOSIS — J45909 Unspecified asthma, uncomplicated: Secondary | ICD-10-CM

## 2020-07-12 DIAGNOSIS — R030 Elevated blood-pressure reading, without diagnosis of hypertension: Secondary | ICD-10-CM | POA: Diagnosis present

## 2020-07-12 DIAGNOSIS — I2602 Saddle embolus of pulmonary artery with acute cor pulmonale: Secondary | ICD-10-CM | POA: Diagnosis not present

## 2020-07-12 DIAGNOSIS — N6310 Unspecified lump in the right breast, unspecified quadrant: Secondary | ICD-10-CM | POA: Diagnosis present

## 2020-07-12 DIAGNOSIS — Z20822 Contact with and (suspected) exposure to covid-19: Secondary | ICD-10-CM | POA: Diagnosis present

## 2020-07-12 DIAGNOSIS — Z79899 Other long term (current) drug therapy: Secondary | ICD-10-CM

## 2020-07-12 DIAGNOSIS — J449 Chronic obstructive pulmonary disease, unspecified: Secondary | ICD-10-CM | POA: Diagnosis present

## 2020-07-12 DIAGNOSIS — I2692 Saddle embolus of pulmonary artery without acute cor pulmonale: Secondary | ICD-10-CM | POA: Diagnosis not present

## 2020-07-12 DIAGNOSIS — S92902A Unspecified fracture of left foot, initial encounter for closed fracture: Secondary | ICD-10-CM | POA: Diagnosis present

## 2020-07-12 DIAGNOSIS — F419 Anxiety disorder, unspecified: Secondary | ICD-10-CM | POA: Diagnosis present

## 2020-07-12 DIAGNOSIS — Z880 Allergy status to penicillin: Secondary | ICD-10-CM

## 2020-07-12 DIAGNOSIS — N63 Unspecified lump in unspecified breast: Secondary | ICD-10-CM

## 2020-07-12 DIAGNOSIS — Z6841 Body Mass Index (BMI) 40.0 and over, adult: Secondary | ICD-10-CM

## 2020-07-12 DIAGNOSIS — Z882 Allergy status to sulfonamides status: Secondary | ICD-10-CM

## 2020-07-12 DIAGNOSIS — Z825 Family history of asthma and other chronic lower respiratory diseases: Secondary | ICD-10-CM

## 2020-07-12 NOTE — ED Triage Notes (Signed)
Patient states increased exertion recently due to breaking her foot. Patient states she had an asthma attack around 1700, in which she attempted to use her inhaler, but was unsuccessful. Patient endorses cough.

## 2020-07-13 ENCOUNTER — Inpatient Hospital Stay (HOSPITAL_COMMUNITY): Payer: PRIVATE HEALTH INSURANCE

## 2020-07-13 ENCOUNTER — Other Ambulatory Visit (HOSPITAL_COMMUNITY): Payer: PRIVATE HEALTH INSURANCE

## 2020-07-13 ENCOUNTER — Emergency Department (HOSPITAL_COMMUNITY): Payer: PRIVATE HEALTH INSURANCE

## 2020-07-13 ENCOUNTER — Encounter (HOSPITAL_COMMUNITY): Payer: Self-pay

## 2020-07-13 DIAGNOSIS — Z825 Family history of asthma and other chronic lower respiratory diseases: Secondary | ICD-10-CM | POA: Diagnosis not present

## 2020-07-13 DIAGNOSIS — J45909 Unspecified asthma, uncomplicated: Secondary | ICD-10-CM | POA: Diagnosis not present

## 2020-07-13 DIAGNOSIS — Z6841 Body Mass Index (BMI) 40.0 and over, adult: Secondary | ICD-10-CM | POA: Diagnosis not present

## 2020-07-13 DIAGNOSIS — J449 Chronic obstructive pulmonary disease, unspecified: Secondary | ICD-10-CM | POA: Diagnosis present

## 2020-07-13 DIAGNOSIS — R030 Elevated blood-pressure reading, without diagnosis of hypertension: Secondary | ICD-10-CM | POA: Diagnosis present

## 2020-07-13 DIAGNOSIS — R252 Cramp and spasm: Secondary | ICD-10-CM | POA: Diagnosis not present

## 2020-07-13 DIAGNOSIS — Z880 Allergy status to penicillin: Secondary | ICD-10-CM | POA: Diagnosis not present

## 2020-07-13 DIAGNOSIS — S92902A Unspecified fracture of left foot, initial encounter for closed fracture: Secondary | ICD-10-CM | POA: Diagnosis present

## 2020-07-13 DIAGNOSIS — I2692 Saddle embolus of pulmonary artery without acute cor pulmonale: Secondary | ICD-10-CM | POA: Diagnosis present

## 2020-07-13 DIAGNOSIS — I2602 Saddle embolus of pulmonary artery with acute cor pulmonale: Principal | ICD-10-CM

## 2020-07-13 DIAGNOSIS — Z882 Allergy status to sulfonamides status: Secondary | ICD-10-CM | POA: Diagnosis not present

## 2020-07-13 DIAGNOSIS — Z79899 Other long term (current) drug therapy: Secondary | ICD-10-CM | POA: Diagnosis not present

## 2020-07-13 DIAGNOSIS — N63 Unspecified lump in unspecified breast: Secondary | ICD-10-CM | POA: Diagnosis not present

## 2020-07-13 DIAGNOSIS — Z20822 Contact with and (suspected) exposure to covid-19: Secondary | ICD-10-CM | POA: Diagnosis present

## 2020-07-13 DIAGNOSIS — N6310 Unspecified lump in the right breast, unspecified quadrant: Secondary | ICD-10-CM | POA: Diagnosis present

## 2020-07-13 DIAGNOSIS — I361 Nonrheumatic tricuspid (valve) insufficiency: Secondary | ICD-10-CM | POA: Diagnosis not present

## 2020-07-13 DIAGNOSIS — F419 Anxiety disorder, unspecified: Secondary | ICD-10-CM | POA: Diagnosis present

## 2020-07-13 LAB — CBC WITH DIFFERENTIAL/PLATELET
Abs Immature Granulocytes: 0.02 10*3/uL (ref 0.00–0.07)
Basophils Absolute: 0.1 10*3/uL (ref 0.0–0.1)
Basophils Relative: 1 %
Eosinophils Absolute: 0.2 10*3/uL (ref 0.0–0.5)
Eosinophils Relative: 1 %
HCT: 46.3 % — ABNORMAL HIGH (ref 36.0–46.0)
Hemoglobin: 15 g/dL (ref 12.0–15.0)
Immature Granulocytes: 0 %
Lymphocytes Relative: 35 %
Lymphs Abs: 4.2 10*3/uL — ABNORMAL HIGH (ref 0.7–4.0)
MCH: 26.8 pg (ref 26.0–34.0)
MCHC: 32.4 g/dL (ref 30.0–36.0)
MCV: 82.7 fL (ref 80.0–100.0)
Monocytes Absolute: 0.7 10*3/uL (ref 0.1–1.0)
Monocytes Relative: 6 %
Neutro Abs: 6.7 10*3/uL (ref 1.7–7.7)
Neutrophils Relative %: 57 %
Platelets: 353 10*3/uL (ref 150–400)
RBC: 5.6 MIL/uL — ABNORMAL HIGH (ref 3.87–5.11)
RDW: 14.5 % (ref 11.5–15.5)
WBC: 11.9 10*3/uL — ABNORMAL HIGH (ref 4.0–10.5)
nRBC: 0 % (ref 0.0–0.2)

## 2020-07-13 LAB — I-STAT CHEM 8, ED
BUN: 13 mg/dL (ref 6–20)
Calcium, Ion: 1.16 mmol/L (ref 1.15–1.40)
Chloride: 106 mmol/L (ref 98–111)
Creatinine, Ser: 0.6 mg/dL (ref 0.44–1.00)
Glucose, Bld: 130 mg/dL — ABNORMAL HIGH (ref 70–99)
HCT: 45 % (ref 36.0–46.0)
Hemoglobin: 15.3 g/dL — ABNORMAL HIGH (ref 12.0–15.0)
Potassium: 3.9 mmol/L (ref 3.5–5.1)
Sodium: 143 mmol/L (ref 135–145)
TCO2: 21 mmol/L — ABNORMAL LOW (ref 22–32)

## 2020-07-13 LAB — COMPREHENSIVE METABOLIC PANEL
ALT: 21 U/L (ref 0–44)
AST: 24 U/L (ref 15–41)
Albumin: 3.9 g/dL (ref 3.5–5.0)
Alkaline Phosphatase: 59 U/L (ref 38–126)
Anion gap: 15 (ref 5–15)
BUN: 10 mg/dL (ref 6–20)
CO2: 16 mmol/L — ABNORMAL LOW (ref 22–32)
Calcium: 9.4 mg/dL (ref 8.9–10.3)
Chloride: 107 mmol/L (ref 98–111)
Creatinine, Ser: 0.67 mg/dL (ref 0.44–1.00)
GFR, Estimated: >60 mL/min (ref >60)
Glucose, Bld: 118 mg/dL — ABNORMAL HIGH (ref 70–99)
Potassium: 4.2 mmol/L (ref 3.5–5.1)
Sodium: 138 mmol/L (ref 135–145)
Total Bilirubin: 0.5 mg/dL (ref 0.3–1.2)
Total Protein: 8.4 g/dL — ABNORMAL HIGH (ref 6.5–8.1)

## 2020-07-13 LAB — PROTIME-INR
INR: 1 (ref 0.8–1.2)
Prothrombin Time: 12.8 seconds (ref 11.4–15.2)

## 2020-07-13 LAB — ECHOCARDIOGRAM COMPLETE
AR max vel: 2.75 cm2
AV Area VTI: 2.45 cm2
AV Area mean vel: 2.34 cm2
AV Mean grad: 3.6 mmHg
AV Peak grad: 6.6 mmHg
Ao pk vel: 1.28 m/s
Area-P 1/2: 8.92 cm2
Calc EF: 63.2 %
Height: 62 in
S' Lateral: 2.45 cm
Single Plane A2C EF: 62.2 %
Single Plane A4C EF: 62.9 %
Weight: 3569.69 oz

## 2020-07-13 LAB — HEPARIN LEVEL (UNFRACTIONATED)
Heparin Unfractionated: 0.15 IU/mL — ABNORMAL LOW (ref 0.30–0.70)
Heparin Unfractionated: 0.15 IU/mL — ABNORMAL LOW (ref 0.30–0.70)

## 2020-07-13 LAB — D-DIMER, QUANTITATIVE: D-Dimer, Quant: 8.3 ug/mL-FEU — ABNORMAL HIGH (ref 0.00–0.50)

## 2020-07-13 LAB — APTT: aPTT: 30 seconds (ref 24–36)

## 2020-07-13 LAB — I-STAT BETA HCG BLOOD, ED (MC, WL, AP ONLY): I-stat hCG, quantitative: <5 m[IU]/mL (ref <5)

## 2020-07-13 LAB — CBC
HCT: 47.6 % — ABNORMAL HIGH (ref 36.0–46.0)
Hemoglobin: 14.9 g/dL (ref 12.0–15.0)
MCH: 26.8 pg (ref 26.0–34.0)
MCHC: 31.3 g/dL (ref 30.0–36.0)
MCV: 85.8 fL (ref 80.0–100.0)
Platelets: 345 10*3/uL (ref 150–400)
RBC: 5.55 MIL/uL — ABNORMAL HIGH (ref 3.87–5.11)
RDW: 14.7 % (ref 11.5–15.5)
WBC: 11.2 10*3/uL — ABNORMAL HIGH (ref 4.0–10.5)
nRBC: 0 % (ref 0.0–0.2)

## 2020-07-13 LAB — HIV ANTIBODY (ROUTINE TESTING W REFLEX): HIV Screen 4th Generation wRfx: NONREACTIVE

## 2020-07-13 LAB — MRSA PCR SCREENING: MRSA by PCR: NEGATIVE

## 2020-07-13 LAB — RESPIRATORY PANEL BY RT PCR (FLU A&B, COVID)
Influenza A by PCR: NEGATIVE
Influenza B by PCR: NEGATIVE
SARS Coronavirus 2 by RT PCR: NEGATIVE

## 2020-07-13 MED ORDER — HEPARIN BOLUS VIA INFUSION
2200.0000 [IU] | Freq: Once | INTRAVENOUS | Status: AC
  Administered 2020-07-13: 2200 [IU] via INTRAVENOUS
  Filled 2020-07-13: qty 2200

## 2020-07-13 MED ORDER — ALBUTEROL SULFATE HFA 108 (90 BASE) MCG/ACT IN AERS
4.0000 | INHALATION_SPRAY | Freq: Once | RESPIRATORY_TRACT | Status: AC
  Administered 2020-07-13: 4 via RESPIRATORY_TRACT
  Filled 2020-07-13: qty 6.7

## 2020-07-13 MED ORDER — IBUPROFEN 200 MG PO TABS
400.0000 mg | ORAL_TABLET | Freq: Four times a day (QID) | ORAL | Status: DC | PRN
  Administered 2020-07-13: 400 mg via ORAL
  Filled 2020-07-13: qty 2

## 2020-07-13 MED ORDER — IOHEXOL 350 MG/ML SOLN
100.0000 mL | Freq: Once | INTRAVENOUS | Status: AC | PRN
  Administered 2020-07-13: 100 mL via INTRAVENOUS

## 2020-07-13 MED ORDER — LABETALOL HCL 5 MG/ML IV SOLN
10.0000 mg | INTRAVENOUS | Status: DC | PRN
  Administered 2020-07-13 – 2020-07-15 (×6): 10 mg via INTRAVENOUS
  Filled 2020-07-13 (×7): qty 4

## 2020-07-13 MED ORDER — ONDANSETRON HCL 4 MG PO TABS: 4.0000 mg | ORAL_TABLET | Freq: Four times a day (QID) | ORAL | Status: DC | PRN

## 2020-07-13 MED ORDER — CHLORHEXIDINE GLUCONATE CLOTH 2 % EX PADS
6.0000 | MEDICATED_PAD | Freq: Every day | CUTANEOUS | Status: DC
  Administered 2020-07-13 – 2020-07-14 (×2): 6 via TOPICAL

## 2020-07-13 MED ORDER — ACETAMINOPHEN 650 MG RE SUPP: 650.0000 mg | Freq: Four times a day (QID) | RECTAL | Status: DC | PRN

## 2020-07-13 MED ORDER — ACETAMINOPHEN 325 MG PO TABS: 650.0000 mg | ORAL_TABLET | Freq: Four times a day (QID) | ORAL | Status: DC | PRN

## 2020-07-13 MED ORDER — ONDANSETRON HCL 4 MG/2ML IJ SOLN: 4.0000 mg | Freq: Four times a day (QID) | INTRAMUSCULAR | Status: DC | PRN

## 2020-07-13 MED ORDER — HYDROCODONE-ACETAMINOPHEN 5-325 MG PO TABS: 1.0000 | ORAL_TABLET | ORAL | Status: DC | PRN

## 2020-07-13 MED ORDER — HEPARIN (PORCINE) 25000 UT/250ML-% IV SOLN
1200.0000 [IU]/h | INTRAVENOUS | Status: DC
  Administered 2020-07-13: 1200 [IU]/h via INTRAVENOUS
  Filled 2020-07-13: qty 250

## 2020-07-13 MED ORDER — HEPARIN BOLUS VIA INFUSION
4000.0000 [IU] | Freq: Once | INTRAVENOUS | Status: AC
  Administered 2020-07-13: 4000 [IU] via INTRAVENOUS
  Filled 2020-07-13: qty 4000

## 2020-07-13 MED ORDER — MORPHINE SULFATE (PF) 2 MG/ML IV SOLN: 2.0000 mg | INTRAVENOUS | Status: DC | PRN

## 2020-07-13 MED ORDER — HEPARIN (PORCINE) 25000 UT/250ML-% IV SOLN
1650.0000 [IU]/h | INTRAVENOUS | Status: DC
  Administered 2020-07-13: 1400 [IU]/h via INTRAVENOUS
  Administered 2020-07-14: 1650 [IU]/h via INTRAVENOUS
  Filled 2020-07-13 (×2): qty 250

## 2020-07-13 MED ORDER — ALBUTEROL SULFATE HFA 108 (90 BASE) MCG/ACT IN AERS
2.0000 | INHALATION_SPRAY | Freq: Four times a day (QID) | RESPIRATORY_TRACT | Status: DC | PRN
  Filled 2020-07-13: qty 6.7

## 2020-07-13 MED ORDER — PERFLUTREN LIPID MICROSPHERE
1.0000 mL | INTRAVENOUS | Status: AC | PRN
  Filled 2020-07-13: qty 10

## 2020-07-13 MED ORDER — HEPARIN BOLUS VIA INFUSION
2000.0000 [IU] | Freq: Once | INTRAVENOUS | Status: AC
  Administered 2020-07-13: 2000 [IU] via INTRAVENOUS
  Filled 2020-07-13: qty 2000

## 2020-07-13 MED ORDER — SODIUM CHLORIDE (PF) 0.9 % IJ SOLN
INTRAMUSCULAR | Status: AC
  Filled 2020-07-13: qty 50

## 2020-07-13 MED ORDER — HYDROXYZINE HCL 10 MG PO TABS
10.0000 mg | ORAL_TABLET | Freq: Three times a day (TID) | ORAL | Status: DC | PRN
  Administered 2020-07-14: 10 mg via ORAL
  Filled 2020-07-13: qty 1

## 2020-07-13 MED ORDER — LABETALOL HCL 5 MG/ML IV SOLN
10.0000 mg | Freq: Four times a day (QID) | INTRAVENOUS | Status: DC | PRN
  Administered 2020-07-13: 10 mg via INTRAVENOUS
  Filled 2020-07-13: qty 4

## 2020-07-13 NOTE — Progress Notes (Signed)
Bilateral lower extremity venous duplex completed. Refer to "CV Proc" under chart review to view preliminary results.  07/13/2020 9:51 AM Eula Fried., MHA, RVT, RDCS, RDMS

## 2020-07-13 NOTE — Consult Note (Signed)
Chief Complaint: Patient was seen in consultation today for consideration of pulmonary embolus thrombolysis Chief Complaint  Patient presents with  . Asthma   at the request of Dr Rhona Leavenshiu   Supervising Physician: Ruel FavorsShick, Trevor  [facility]  History of Present Illness: Joy Boyer is a 34 y.o. female   Pt with Hx Asthma Worsening SOB since last pm Inhaler was not helping SOB [facility]  CTA: revealing saddle PE with Rt heart strain D Dimer 8.3 98% RA; Resp 14 EKG: Sinus tachycardia Otherwise within normal limits  Echo: 1. Left ventricular ejection fraction, by estimation, is 60 to 65%. The left ventricle has normal function. The left ventricle has no regional wall motion abnormalities. Left ventricular diastolic parameters are consistent with Grade I diastolic dysfunction (impaired relaxation). 2. Right ventricular systolic function is normal. The right ventricular size is normal. There is normal pulmonary artery systolic pressure. 3. The mitral valve was not well visualized. No evidence of mitral valve regurgitation. No evidence of mitral stenosis. 4. The aortic valve has an indeterminant number of cusps. Aortic valve regurgitation is not visualized. No aortic stenosis is present.  Doppler :Summary:  RIGHT:  - There is no evidence of deep vein thrombosis in the lower extremity.  - No cystic structure found in the popliteal fossa.  LEFT:  - Findings consistent with acute deep vein thrombosis involving the left  posterior tibial veins, and left peroneal veins.  - No cystic structure found in the popliteal fossa.  Bilateral lower extremity venous flow is pulsatile, suggestive of possibly  elevated right heart pressure.   TRH requesting evaluation for possible PE Lysis  Imaging and chart reviewed with Dr Miles CostainShick  Past Medical History:  Diagnosis Date  . Acne   . Asthma   . Hx of migraines     History reviewed. No pertinent  surgical history.  Allergies: Penicillins and Sulfa antibiotics  Medications: Prior to Admission medications   Medication Sig Start Date End Date Taking? Authorizing Provider  albuterol (PROVENTIL HFA;VENTOLIN HFA) 108 (90 BASE) MCG/ACT inhaler Inhale 2 puffs into the lungs every 6 (six) hours as needed for wheezing or shortness of breath. 09/30/14  Yes Elvina SidleLauenstein, Kurt, MD  estradiol (CLIMARA - DOSED IN MG/24 HR) 0.05 mg/24hr patch PLACE 1 PATCH ONTO THE SKIN ONCE A WEEK. 11/26/19 11/25/20 Yes [provider]  ibuprofen (ADVIL) 200 MG tablet Take 200 mg by mouth 2 (two) times daily.   Yes [provider]  ibuprofen (ADVIL) 800 MG tablet Take by mouth. 06/20/20 07/20/20 Yes [provider]  loratadine (CLARITIN) 10 MG tablet Take 10 mg by mouth daily.   Yes [provider]  Norethindrone Acetate-Ethinyl Estrad-FE (LOESTRIN 24 FE) 1-20 MG-MCG(24) tablet Take 1 tablet by mouth daily. 11/26/19 11/25/20 Yes [provider]  chlorpheniramine-HYDROcodone (TUSSIONEX PENNKINETIC ER) 10-8 MG/5ML SUER Take 5 mLs by mouth every 12 (twelve) hours as needed for cough. Patient not taking: Reported on 07/13/2020 09/13/16   McVey, Madelaine BhatElizabeth Whitney, PA-C  predniSONE (DELTASONE) 20 MG tablet Take 3 PO QAM x3days, 2 PO QAM x3days, 1 PO QAM x3days Patient not taking: Reported on 07/13/2020 09/13/16   McVey, Madelaine BhatElizabeth Whitney, PA-C     Family History  Problem Relation Age of Onset  . Hyperlipidemia Mother   . Hypertension Mother   . Mental illness Father   . Cancer Father   . Diabetes Father   . Heart disease Father   . Cancer Maternal  Aunt   . Mental illness Paternal Uncle   . Heart disease Paternal Uncle   . Mental retardation Maternal Grandmother   . COPD Paternal Grandmother   . Cancer Paternal Grandfather     Social History   Socioeconomic History  . Marital status: Single    Spouse name: Not on file  . Number of children: Not on file  . Years of  education: Not on file  . Highest education level: Not on file  Occupational History  . Not on file  Tobacco Use  . Smoking status: Never Smoker  . Smokeless tobacco: Never Used  Substance and Sexual Activity  . Alcohol use: Yes    Comment: rare  . Drug use: Never  . Sexual activity: Never  Other Topics Concern  . 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    Social Determinants of Health   Financial Resource Strain:   . Difficulty of Paying Living Expenses: Not on file  Food Insecurity:   . Worried About Programme researcher, broadcasting/film/video in the Last Year: Not on file  . Ran Out of Food in the Last Year: Not on file  Transportation Needs:   . Lack of Transportation (Medical): Not on file  . Lack of Transportation (Non-Medical): Not on file  Physical Activity:   . Days of Exercise per Week: Not on file  . Minutes of Exercise per Session: Not on file  Stress:   . Feeling of Stress : Not on file  Social Connections:   . Frequency of Communication with Friends and Family: Not on file  . Frequency of Social Gatherings with Friends and Family: Not on file  . Attends Religious Services: Not on file  . Active Member of Clubs or Organizations: Not on file  . Attends Banker Meetings: Not on file  . Marital Status: Not on file    Review of Systems: A 12 point ROS discussed and pertinent positives are indicated in the HPI above.  All other systems are negative.  Review of Systems  Constitutional: Positive for activity change. Negative for fatigue and fever.  HENT: Negative for sore throat.   Respiratory: Positive for shortness of breath. Negative for cough, choking and wheezing.   Cardiovascular: Negative for chest pain.  Gastrointestinal: Negative for abdominal pain.  Musculoskeletal: Negative for back pain.  Neurological: Negative for weakness.  Psychiatric/Behavioral: Negative for behavioral problems and  confusion.    Vital Signs: BP (!) 166/122   Pulse (!) 102   Temp 98.1 F (36.7 C) (Oral)   Resp 16   Ht  (1.575 m)   Wt 223 lb 1.7 oz (101.2 kg)   SpO2 97%   BMI 40.81 kg/m   Physical Exam Vitals reviewed.  Constitutional:      Appearance: Normal appearance. She is not ill-appearing or diaphoretic.  HENT:     Mouth/Throat:     Mouth: Mucous membranes are moist.  Cardiovascular:     Rate and Rhythm: Normal rate and regular rhythm.     Heart sounds: Normal heart sounds.  Pulmonary:     Effort: Pulmonary effort is normal.     Breath sounds: Normal breath sounds.  Abdominal:     Palpations: Abdomen is soft.     Tenderness: There is no abdominal tenderness.  Musculoskeletal:        General: Normal range of motion.     Right lower leg: No  edema.     Left lower leg: No edema.     Comments: Left foot fracture  Skin:    General: Skin is warm.  Neurological:     Mental Status: She is alert and oriented to person, place, and time.  Psychiatric:        Behavior: Behavior normal.     Imaging: DG Chest 2 View  Result Date: 07/13/2020 CLINICAL DATA:  Tachycardia, shortness of breath EXAM: CHEST - 2 VIEW COMPARISON:  None. FINDINGS: Low lung volumes with vascular crowding. Some hazy opacity in the right lung base adjacent the asymmetrically elevated right hemidiaphragm is favored to reflect atelectasis without other focal opacity, convincing features of edema, pneumothorax or effusion. The cardiomediastinal contours are unremarkable. Telemetry leads overlie the chest. No acute osseous or soft tissue abnormality. IMPRESSION: Low lung volumes with vascular crowding and likely right basilar atelectasis. Electronically Signed   By: Kreg Shropshire M.D.   On: 07/13/2020 00:41   CT Angio Chest PE W/Cm &/Or Wo Cm  Result Date: 07/13/2020 CLINICAL DATA:  Cough, recently broke foot with increasing exertion, PE suspected, suspected asthma attack but without relief with inhaler. EXAM: CT  ANGIOGRAPHY CHEST WITH CONTRAST TECHNIQUE: Multidetector CT imaging of the chest was performed using the standard protocol during bolus administration of intravenous contrast. Multiplanar CT image reconstructions and MIPs were obtained to evaluate the vascular anatomy. CONTRAST:  OMNIPAQUE IOHEXOL 350 MG/ML SOLN COMPARISON:  Radiograph 07/13/2020 FINDINGS: Cardiovascular: Satisfactory opacification of the pulmonary arteries with evaluation slightly limited by some some respiratory motion artifact which more pronounced towards the lung bases. There is however visible saddle pulmonary embolus extending between the left and right main stem bronchus and into the lobar and segmental branches of the right upper, middle and both lower lobes as well as into the lingular segmental branches as well. Flattening of the intraventricular septum with elevation of the RV/LV ratio to 1.15. Slight reflux of contrast into the IVC as well. Cardiac size is otherwise within normal limits. The aortic root is suboptimally assessed given cardiac pulsation artifact. The aorta is normal caliber. No acute luminal abnormality of the imaged aorta. No periaortic stranding or hemorrhage. Mediastinum/Nodes: Wedge-shaped soft tissue seen on in the anterior mediastinum with some fatty stippling. No mediastinal fluid or gas. Normal thyroid gland and thoracic inlet. No acute abnormality of the trache the ankle thanks in native distal DA given the colon anise nasally a or esophagus. No worrisome mediastinal, hilar or axillary adenopathy. Lungs/Pleura: Evaluation lung parenchyma limited by extensive respiratory motion artifact. No consolidation, features of edema, pneumothorax, or effusion. No suspicious pulmonary nodules or masses. Upper Abdomen: No acute abnormalities present in the visualized portions of the upper abdomen. Diffuse hepatic hypoattenuation compatible with hepatic steatosis. Sparing along the gallbladder fossa. Musculoskeletal: No  acute osseous abnormality or suspicious osseous lesions. There is a ovoid 1.8 cm mass in the lower outer quadrant of the right breast (4/65). No other worrisome or conspicuous chest wall lesions are evident. Review of the MIP images confirms the above findings. IMPRESSION: 1. Saddle pulmonary embolus extending between the left and right main stem bronchus and into the lobar and segmental branches of the right upper, middle and both lower lobes as well as into the lingular segmental branches as well. Positive for acute PE with CT evidence of right heart strain (RV/LV Ratio 1.15) consistent with at least submassive (intermediate risk) PE. The presence of right heart strain has been associated with an increased risk of morbidity  and mortality. 2. Ovoid 1.8 cm mass in the lower outer quadrant of the right breast. Recommend further evaluation with mammography and ultrasound if not previously performed. 3. Wedge-shaped soft tissue in the anterior mediastinum with some fatty stippling, favored to reflect residual thymus. 4. Hepatic steatosis. Critical Value/emergent results were called by telephone at the time of interpretation on 07/13/2020 at 1:02 am to provider PA McDonald , who verbally acknowledged these results. Electronically Signed   By: Kreg Shropshire M.D.   On: 07/13/2020 01:03   ECHOCARDIOGRAM COMPLETE  Result Date: 07/13/2020    ECHOCARDIOGRAM REPORT   Patient Name:   DYANNA SEITER Date of Exam: 07/13/2020 Medical Rec #:  [id number]    Height:       62.0 in Accession #:    [id number]   Weight:       223.1 lb Date of Birth:  1986/06/19    BSA:          2.003 m Patient Age:    34 years     BP:           167/121 mmHg Patient Gender: F            HR:           106 bpm. Exam Location:  Inpatient Procedure: 2D Echo and Intracardiac Opacification Agent Indications:    Pulmonary Embolus 415.19 / I26.99  History:        Patient has no prior history of Echocardiogram examinations.  Sonographer:    Leta Jungling RDCS  Referring Phys: [id number] [facility] KHAN IMPRESSIONS  1. Left ventricular ejection fraction, by estimation, is 60 to 65%. The left ventricle has normal function. The left ventricle has no regional wall motion abnormalities. Left ventricular diastolic parameters are consistent with Grade I diastolic dysfunction (impaired relaxation).  2. Right ventricular systolic function is normal. The right ventricular size is normal. There is normal pulmonary artery systolic pressure.  3. The mitral valve was not well visualized. No evidence of mitral valve regurgitation. No evidence of mitral stenosis.  4. The aortic valve has an indeterminant number of cusps. Aortic valve regurgitation is not visualized. No aortic stenosis is present. FINDINGS  Left Ventricle: Left ventricular ejection fraction, by estimation, is 60 to 65%. The left ventricle has normal function. The left ventricle has no regional wall motion abnormalities. Definity contrast agent was given IV to delineate the left ventricular  endocardial borders. The left ventricular internal cavity size was normal in size. There is no left ventricular hypertrophy. Left ventricular diastolic parameters are consistent with Grade I diastolic dysfunction (impaired relaxation). Normal left ventricular filling pressure. Right Ventricle: The right ventricular size is normal. No increase in right ventricular wall thickness. Right ventricular systolic function is normal. There is normal pulmonary artery systolic pressure. The tricuspid regurgitant velocity is 2.20 m/s, and  with an assumed right atrial pressure of 3 mmHg, the estimated right ventricular systolic pressure is 22.4 mmHg. Left Atrium: Left atrial size was normal in size. Right Atrium: Right atrial size was normal in size. Pericardium: There is no evidence of pericardial effusion. Mitral Valve: The mitral valve was not well visualized. No evidence of mitral valve regurgitation. No evidence of mitral valve stenosis.  Tricuspid Valve: The tricuspid valve is normal in structure. Tricuspid valve regurgitation is mild . No evidence of tricuspid stenosis. Aortic Valve: The aortic valve has an indeterminant number of cusps. Aortic valve regurgitation is not visualized. No aortic stenosis is present. Aortic valve mean gradient  measures 3.6 mmHg. Aortic valve peak gradient measures 6.6 mmHg. Aortic valve area, by VTI measures 2.45 cm. Pulmonic Valve: The pulmonic valve was not well visualized. Pulmonic valve regurgitation is not visualized. No evidence of pulmonic stenosis. Aorta: The aortic root is normal in size and structure. Pulmonary Artery: Indeterminant PASP, IVC poorly visualized. Venous: The inferior vena cava was not well visualized. IAS/Shunts: The interatrial septum was not well visualized.  LEFT VENTRICLE PLAX 2D LVIDd:         3.71 cm     Diastology LVIDs:         2.45 cm     LV e' medial:    10.40 cm/s LV PW:         1.05 cm     LV E/e' medial:  5.7 LV IVS:        0.94 cm     LV e' lateral:   10.30 cm/s LVOT diam:     1.90 cm     LV E/e' lateral: 5.8 LV SV:         46 LV SV Index:   23 LVOT Area:     2.84 cm  LV Volumes (MOD) LV vol d, MOD A2C: 79.7 ml LV vol d, MOD A4C: 76.6 ml LV vol s, MOD A2C: 30.1 ml LV vol s, MOD A4C: 28.4 ml LV SV MOD A2C:     49.6 ml LV SV MOD A4C:     76.6 ml LV SV MOD BP:      49.7 ml LEFT ATRIUM             Index LA diam:        2.20 cm 1.10 cm/m LA Vol (A2C):   16.6 ml 8.29 ml/m LA Vol (A4C):   22.9 ml 11.44 ml/m LA Biplane Vol: 19.7 ml 9.84 ml/m  AORTIC VALVE AV Area (Vmax):    2.75 cm AV Area (Vmean):   2.34 cm AV Area (VTI):     2.45 cm AV Vmax:           127.99 cm/s AV Vmean:          90.095 cm/s AV VTI:            0.187 m AV Peak Grad:      6.6 mmHg AV Mean Grad:      3.6 mmHg LVOT Vmax:         124.24 cm/s LVOT Vmean:        74.361 cm/s LVOT VTI:          0.161 m LVOT/AV VTI ratio: 0.86  AORTA Ao Root diam: 2.80 cm MITRAL VALVE               TRICUSPID VALVE MV Area (PHT): 8.92  cm    TR Peak grad:   19.4 mmHg MV Decel Time: 85 msec     TR Vmax:        220.00 cm/s MV E velocity: 59.70 cm/s MV A velocity: 77.10 cm/s  SHUNTS MV E/A ratio:  0.77        Systemic VTI:  0.16 m                            Systemic Diam: 1.90 cm Dina Rich MD Electronically signed by Dina Rich MD Signature Date/Time: 07/13/2020/12:32:26 PM    Final    VAS [place] LOWER EXTREMITY VENOUS (DVT)  Result Date: 07/13/2020  Lower Venous DVT  Study Indications: Saddle pulmonary embolism. Left fifth metatarsal fracture x3 weeks with left calf cramping approximately 3 weeks ago. Non-weight bearing precautions on LLE.  Comparison Study: No prior study Performing Technologist: Gertie Fey MHA, RDMS, RVT, RDCS  Examination Guidelines: A complete evaluation includes B-mode imaging, spectral Doppler, color Doppler, and power Doppler as needed of all accessible portions of each vessel. Bilateral testing is considered an integral part of a complete examination. Limited examinations for reoccurring indications may be performed as noted. The reflux portion of the exam is performed with the patient in reverse Trendelenburg.  +---------+---------------+---------+-----------+----------+--------------+ RIGHT    CompressibilityPhasicitySpontaneityPropertiesThrombus Aging +---------+---------------+---------+-----------+----------+--------------+ CFV      Full           No       Yes                                 +---------+---------------+---------+-----------+----------+--------------+ SFJ      Full                                                        +---------+---------------+---------+-----------+----------+--------------+ FV Prox  Full                                                        +---------+---------------+---------+-----------+----------+--------------+ FV Mid   Full                                                         +---------+---------------+---------+-----------+----------+--------------+ FV DistalFull                                                        +---------+---------------+---------+-----------+----------+--------------+ PFV      Full                                                        +---------+---------------+---------+-----------+----------+--------------+ POP      Full           No       Yes                                 +---------+---------------+---------+-----------+----------+--------------+ PTV      Full                                                        +---------+---------------+---------+-----------+----------+--------------+ PERO     Full                                                        +---------+---------------+---------+-----------+----------+--------------+   +---------+---------------+---------+-----------+----------+--------------+  LEFT     CompressibilityPhasicitySpontaneityPropertiesThrombus Aging +---------+---------------+---------+-----------+----------+--------------+ CFV      Full           No       Yes                                 +---------+---------------+---------+-----------+----------+--------------+ SFJ      Full                                                        +---------+---------------+---------+-----------+----------+--------------+ FV Prox  Full                                                        +---------+---------------+---------+-----------+----------+--------------+ FV Mid   Full                                                        +---------+---------------+---------+-----------+----------+--------------+ FV DistalFull                                                        +---------+---------------+---------+-----------+----------+--------------+ PFV      Full                                                         +---------+---------------+---------+-----------+----------+--------------+ POP      Full           No       Yes                                 +---------+---------------+---------+-----------+----------+--------------+ PTV      None                    No                   Acute          +---------+---------------+---------+-----------+----------+--------------+ PERO     None                    No                   Acute          +---------+---------------+---------+-----------+----------+--------------+     Summary: RIGHT: - There is no evidence of deep vein thrombosis in the lower extremity.  - No cystic structure found in the popliteal fossa.  LEFT: - Findings consistent with acute deep vein thrombosis involving the left posterior tibial veins, and left peroneal veins. - No cystic structure found in the popliteal fossa.  Bilateral lower extremity venous flow is pulsatile, suggestive of possibly elevated right heart pressure.  *See table(s) above for measurements and observations.    Preliminary     Labs:  CBC: Recent Labs    07/13/20 0005 07/13/20 0036 07/13/20 0822  WBC 11.9*  --  11.2*  HGB 15.0 15.3* 14.9  HCT 46.3* 45.0 47.6*  PLT 353  --  345    COAGS: Recent Labs    07/13/20 0209  INR 1.0  APTT 30    BMP: Recent Labs    07/13/20 0036 07/13/20 0822  NA 143 138  K 3.9 4.2  CL 106 107  CO2  --  16*  GLUCOSE 130* 118*  BUN 13 10  CALCIUM  --  9.4  CREATININE 0.60 0.67  GFRNONAA  --  >60    LIVER FUNCTION TESTS: Recent Labs    07/13/20 0822  BILITOT 0.5  AST 24  ALT 21  ALKPHOS 59  PROT 8.4*  ALBUMIN 3.9    TUMOR MARKERS: No results for input(s): AFPTM, CEA, CA199, CHROMGRNA in the last 8760 hours.  Assessment and Plan:  I have examine pt and talked with her and mother at bedside Spoke to them regarding PE Thrombolysis indications and risks Not a candidate for thrombolysis at this point per Dr Miles Costain In NAD; 97% RA Resp 16; No  heart strain per Echo On heparin drip and feeling better already  I have reviewed imaging and chart with Dr Miles Costain I have messaged Dr Rhona Leavens IR will keep pt on radar for now. If symptoms change-- please contact IR  Thank you for this interesting consult.  I greatly enjoyed meeting Raima Geathers and look forward to participating in their care.  A copy of this report was sent to the requesting provider on this date.  Electronically Signed: Robet Leu, PA-C 07/13/2020, 12:34 PM   I spent a total of 40 Minutes    in face to face in clinical consultation, greater than 50% of which was counseling/coordinating care for PE Lysis

## 2020-07-13 NOTE — Progress Notes (Signed)
ANTICOAGULATION CONSULT NOTE - Follow Up Consult  Pharmacy Consult for Heparin Indication: pulmonary embolus  Allergies  Allergen Reactions  . Penicillins Rash    Arms and chest. Has tolerated amoxicillin and Augmentin many times since then without difficulty.  . Sulfa Antibiotics Other (See Comments)    Behavior change.  Became agitated and angry.    Patient Measurements: Height: 5\' 2"  (157.5 cm) Weight: 101.2 kg (223 lb 1.7 oz) IBW/kg (Calculated) : 50.1  Height: 62 inches, Weight 90.7 kg (07/08/2020) Heparin Dosing Weight: 71 kg  Vital Signs: Temp: 97.9 F (36.6 C) (11/07 1737) Temp Source: Oral (11/07 1737) BP: 151/100 (11/07 1700) Pulse Rate: 90 (11/07 1600)  Labs: Recent Labs    07/13/20 0005 07/13/20 0005 07/13/20 0036 07/13/20 0209 07/13/20 0822 07/13/20 1610  HGB 15.0   < > 15.3*  --  14.9  --   HCT 46.3*  --  45.0  --  47.6*  --   PLT 353  --   --   --  345  --   APTT  --   --   --  30  --   --   LABPROT  --   --   --  12.8  --   --   INR  --   --   --  1.0  --   --   HEPARINUNFRC  --   --   --   --  0.15* 0.15*  CREATININE  --   --  0.60  --  0.67  --    < > = values in this interval not displayed.    Estimated Creatinine Clearance: 108.2 mL/min (by C-G formula based on SCr of 0.67 mg/dL).   Medications:  Scheduled:  . Chlorhexidine Gluconate Cloth  6 each Topical Daily   Infusions:  . heparin 1,400 Units/hr (07/13/20 1700)   PRN: acetaminophen **OR** acetaminophen, albuterol, HYDROcodone-acetaminophen, hydrOXYzine, labetalol, morphine injection, ondansetron **OR** ondansetron (ZOFRAN) IV  Assessment: 34 yo female present with SOB and wheezing. Pt is recovering from a fracture of her foot, has been nonweightbearing for the past 3 weeks.  Pharmacy consulted to dose heparin for PE.  No prior AC noted.  Today, 07/13/2020  Heparin level remains subtherapeutic despite rate increase and bolus  CBC stable  No bleeding or complications with IV nor  was the infusion paused  Goal of Therapy:  Heparin level 0.3-0.7 units/ml Monitor platelets by anticoagulation protocol: Yes   Plan:   Heparin IV bolus 2200 units  Increase Heparin IV infusion to 1650 units/hr  Recheck heparin level in 6hrs  Daily heparin level and CBC  Monitor for signs/symptoms of bleeding  13/03/2020, PharmD, [facility] [phone] 07/13/2020,5:47 PM

## 2020-07-13 NOTE — Progress Notes (Signed)
  Echocardiogram 2D Echocardiogram with deifnity has been performed.  Leta Jungling M 07/13/2020, 10:54 AM

## 2020-07-13 NOTE — H&P (Signed)
History and Physical    Joy Boyer GUR:[id number] DOB: [birth year] DOA: 07/12/2020  PCP: Pcp, No (Confirm with patient/family/NH records and if not entered, this has to be entered at [facility] point of entry) Patient coming from: Home  I have personally briefly reviewed patient's old medical records in [facility]  Chief Complaint: Exertional dyspnea  HPI: Joy Boyer is a 34 y.o. female with medical history significant of asthma and recent fracture of her right foot presented to ED for evaluation of worsening shortness of breath.  Patient states that she started having sudden onset of shortness of breath and exertional dyspnea around 5 PM yesterday.  Patient used her albuterol inhaler but it did not help with shortness of breath and she decided to visit the ED for evaluation.  Patient states that she was recently had a fracture of her left foot and she is using wheelchair, walker and crutches for the last 3 weeks.  Patient otherwise denies fever, chills, chest pain, nausea, vomiting, abdominal pain and urinary symptoms.  Patient denies using tobacco alcohol and illicit drugs.  Patient admits of using oral contraceptives.  (For level 3, the HPI must include 4+ descriptors: Location, Quality, Severity, Duration, Timing, Context, modifying factors, associated signs/symptoms and/or status of 3+ chronic problems.)  (Please avoid self-populating past medical history here) (The initial 2-3 lines should be focused and good to copy and paste in the HPI section of the daily progress note).  ED Course: On arrival to ED patient had blood pressure of 204/120, heart rate 121, temperature 97, respiratory rate 20 and oxygen saturation 98% on room air.  Blood work showed WBC 11.9, hemoglobin 15.3.  BUN 13, creatinine 0.6, blood glucose 130.  X-ray chest showed low lung volumes with vascular crowding.  EKG showed sinus tachycardia but no acute ischemic changes.  CT angiogram chest was done that showed saddle  pulmonary embolism extended between the left and right mainstem bronchus and into lobar and segmental branches of right upper, middle and both lower lobes as well as the lingular segmental branches..  CT angiogram also showed evidence of right heart strain.  Patient started on heparin drip and will be admitted to stepdown unit for further management.  Review of Systems: As per HPI otherwise 10 point review of systems negative.  Unacceptable ROS statements: "10 systems reviewed," "Extensive" (without elaboration).  Acceptable ROS statements: "All others negative," "All others reviewed and are negative," and "All others unremarkable," with at LEAST ONE ROS documented Can't double dip - if using for HPI can't use for ROS  Past Medical History:  Diagnosis Date  . Acne   . Asthma   . Hx of migraines     History reviewed. No pertinent surgical history.   reports that she has never smoked. She has never used smokeless tobacco. She reports current alcohol use. She reports that she does not use drugs.  Allergies  Allergen Reactions  . Penicillins Rash    Arms and chest. Has tolerated amoxicillin and Augmentin many times since then without difficulty.  . Sulfa Antibiotics Other (See Comments)    Behavior change.  Became agitated and angry.    Family History  Problem Relation Age of Onset  . Hyperlipidemia Mother   . Hypertension Mother   . Mental illness Father   . Cancer Father   . Diabetes Father   . Heart disease Father   . Cancer Maternal Aunt   . Mental illness Paternal Uncle   . Heart disease Paternal Uncle   .  Mental retardation Maternal Grandmother   . COPD Paternal Grandmother   . Cancer Paternal Grandfather     Unacceptable: Noncontributory, unremarkable, or negative. Acceptable: (example)Family history negative for heart disease  Prior to Admission medications   Medication Sig Start Date End Date Taking? Authorizing Provider  albuterol (PROVENTIL HFA;VENTOLIN HFA) 108  (90 BASE) MCG/ACT inhaler Inhale 2 puffs into the lungs every 6 (six) hours as needed for wheezing or shortness of breath. 09/30/14  Yes Elvina SidleLauenstein, Kurt, MD  estradiol (CLIMARA - DOSED IN MG/24 HR) 0.05 mg/24hr patch PLACE 1 PATCH ONTO THE SKIN ONCE A WEEK. 11/26/19 11/25/20 Yes [provider]  ibuprofen (ADVIL) 200 MG tablet Take 200 mg by mouth 2 (two) times daily.   Yes [provider]  ibuprofen (ADVIL) 800 MG tablet Take by mouth. 06/20/20 07/20/20 Yes [provider]  loratadine (CLARITIN) 10 MG tablet Take 10 mg by mouth daily.   Yes [provider]  Norethindrone Acetate-Ethinyl Estrad-FE (LOESTRIN 24 FE) 1-20 MG-MCG(24) tablet Take 1 tablet by mouth daily. 11/26/19 11/25/20 Yes [provider]  chlorpheniramine-HYDROcodone (TUSSIONEX PENNKINETIC ER) 10-8 MG/5ML SUER Take 5 mLs by mouth every 12 (twelve) hours as needed for cough. Patient not taking: Reported on 07/13/2020 09/13/16   McVey, Madelaine BhatElizabeth Whitney, PA-C  predniSONE (DELTASONE) 20 MG tablet Take 3 PO QAM x3days, 2 PO QAM x3days, 1 PO QAM x3days Patient not taking: Reported on 07/13/2020 09/13/16   Sebastian AcheMcVey, Elizabeth Whitney, PA-C    Physical Exam: Vitals:   07/13/20 0446 07/13/20 0500 07/13/20 0541 07/13/20 0600  BP: (!) 203/134 (!) 204/120 (!) 158/92 (!) 150/105  Pulse: (!) 121 (!) 121 (!) 102 99  Resp: 15 20 14 15   Temp: (!) 97 F (36.1 C)     TempSrc: Axillary     SpO2: 95% 98% 94% 98%    Constitutional: NAD, calm, comfortable Vitals:   07/13/20 0446 07/13/20 0500 07/13/20 0541 07/13/20 0600  BP: (!) 203/134 (!) 204/120 (!) 158/92 (!) 150/105  Pulse: (!) 121 (!) 121 (!) 102 99  Resp: 15 20 14 15   Temp: (!) 97 F (36.1 C)     TempSrc: Axillary     SpO2: 95% 98% 94% 98%    General: 34 year old obese Caucasian female in no acute distress Eyes: PERRL, lids and conjunctivae normal ENMT: Mucous membranes are moist. Posterior pharynx clear of any exudate or lesions.Normal  dentition.  Neck: normal, supple, no masses, no thyromegaly Respiratory: Patient saturating well on room air clear to auscultation bilaterally, no wheezing, no crackles. Normal respiratory effort. No accessory muscle use.  Cardiovascular: Regular rate and rhythm, no murmurs / rubs / gallops. No extremity edema. 2+ pedal pulses. No carotid bruits.  Abdomen: no tenderness, no masses palpated. No hepatosplenomegaly. Bowel sounds positive.  Musculoskeletal: no clubbing / cyanosiS.  Patient has a Cam walker on her left leg.  No pain on palpation and movement. Skin: no rashes, lesions, ulcers. No induration Neurologic: CN 2-12 grossly intact. Sensation intact, DTR normal. Strength 5/5 in all 4.  Psychiatric: Normal judgment and insight. Alert and oriented x 3. Normal mood.   (Anything < 9 systems with 2 bullets each down codes to level 1) (If patient refuses exam can't bill higher level) (Make sure to document decubitus ulcers present on admission -- if possible -- and whether patient has chronic indwelling catheter at time of admission)  Labs on Admission: I have personally reviewed following labs and imaging studies  CBC: Recent Labs  Lab 07/13/20 0005  07/13/20 0036  WBC 11.9*  --   NEUTROABS 6.7  --   HGB 15.0 15.3*  HCT 46.3* 45.0  MCV 82.7  --   PLT 353  --    Basic Metabolic Panel: Recent Labs  Lab 07/13/20 0036  NA 143  K 3.9  CL 106  GLUCOSE 130*  BUN 13  CREATININE 0.60   GFR: CrCl cannot be calculated (Unknown ideal weight.). Liver Function Tests: No results for input(s): AST, ALT, ALKPHOS, BILITOT, PROT, ALBUMIN in the last 168 hours. No results for input(s): LIPASE, AMYLASE in the last 168 hours. No results for input(s): AMMONIA in the last 168 hours. Coagulation Profile: Recent Labs  Lab 07/13/20 0209  INR 1.0   Cardiac Enzymes: No results for input(s): CKTOTAL, CKMB, CKMBINDEX, TROPONINI in the last 168 hours. BNP (last 3 results) No results for input(s):  PROBNP in the last 8760 hours. HbA1C: No results for input(s): HGBA1C in the last 72 hours. CBG: No results for input(s): GLUCAP in the last 168 hours. Lipid Profile: No results for input(s): CHOL, HDL, LDLCALC, TRIG, CHOLHDL, LDLDIRECT in the last 72 hours. Thyroid Function Tests: No results for input(s): TSH, T4TOTAL, FREET4, T3FREE, THYROIDAB in the last 72 hours. Anemia Panel: No results for input(s): VITAMINB12, FOLATE, FERRITIN, TIBC, IRON, RETICCTPCT in the last 72 hours. Urine analysis:    Component Value Date/Time   BILIRUBINUR small 06/09/2014 1448   PROTEINUR trace 06/09/2014 1448   UROBILINOGEN 0.2 06/09/2014 1448   NITRITE neg 06/09/2014 1448   LEUKOCYTESUR Negative 06/09/2014 1448    Radiological Exams on Admission: DG Chest 2 View  Result Date: 07/13/2020 CLINICAL DATA:  Tachycardia, shortness of breath EXAM: CHEST - 2 VIEW COMPARISON:  None. FINDINGS: Low lung volumes with vascular crowding. Some hazy opacity in the right lung base adjacent the asymmetrically elevated right hemidiaphragm is favored to reflect atelectasis without other focal opacity, convincing features of edema, pneumothorax or effusion. The cardiomediastinal contours are unremarkable. Telemetry leads overlie the chest. No acute osseous or soft tissue abnormality. IMPRESSION: Low lung volumes with vascular crowding and likely right basilar atelectasis. Electronically Signed   By: Kreg Shropshire M.D.   On: 07/13/2020 00:41   CT Angio Chest PE W/Cm &/Or Wo Cm  Result Date: 07/13/2020 CLINICAL DATA:  Cough, recently broke foot with increasing exertion, PE suspected, suspected asthma attack but without relief with inhaler. EXAM: CT ANGIOGRAPHY CHEST WITH CONTRAST TECHNIQUE: Multidetector CT imaging of the chest was performed using the standard protocol during bolus administration of intravenous contrast. Multiplanar CT image reconstructions and MIPs were obtained to evaluate the vascular anatomy. CONTRAST:   OMNIPAQUE IOHEXOL 350 MG/ML SOLN COMPARISON:  Radiograph 07/13/2020 FINDINGS: Cardiovascular: Satisfactory opacification of the pulmonary arteries with evaluation slightly limited by some some respiratory motion artifact which more pronounced towards the lung bases. There is however visible saddle pulmonary embolus extending between the left and right main stem bronchus and into the lobar and segmental branches of the right upper, middle and both lower lobes as well as into the lingular segmental branches as well. Flattening of the intraventricular septum with elevation of the RV/LV ratio to 1.15. Slight reflux of contrast into the IVC as well. Cardiac size is otherwise within normal limits. The aortic root is suboptimally assessed given cardiac pulsation artifact. The aorta is normal caliber. No acute luminal abnormality of the imaged aorta. No periaortic stranding or hemorrhage. Mediastinum/Nodes: Wedge-shaped soft tissue seen on in the anterior mediastinum with some fatty stippling. No  mediastinal fluid or gas. Normal thyroid gland and thoracic inlet. No acute abnormality of the trache the ankle thanks in native distal DA given the colon anise nasally a or esophagus. No worrisome mediastinal, hilar or axillary adenopathy. Lungs/Pleura: Evaluation lung parenchyma limited by extensive respiratory motion artifact. No consolidation, features of edema, pneumothorax, or effusion. No suspicious pulmonary nodules or masses. Upper Abdomen: No acute abnormalities present in the visualized portions of the upper abdomen. Diffuse hepatic hypoattenuation compatible with hepatic steatosis. Sparing along the gallbladder fossa. Musculoskeletal: No acute osseous abnormality or suspicious osseous lesions. There is a ovoid 1.8 cm mass in the lower outer quadrant of the right breast (4/65). No other worrisome or conspicuous chest wall lesions are evident. Review of the MIP images confirms the above findings. IMPRESSION: 1.  Saddle pulmonary embolus extending between the left and right main stem bronchus and into the lobar and segmental branches of the right upper, middle and both lower lobes as well as into the lingular segmental branches as well. Positive for acute PE with CT evidence of right heart strain (RV/LV Ratio 1.15) consistent with at least submassive (intermediate risk) PE. The presence of right heart strain has been associated with an increased risk of morbidity and mortality. 2. Ovoid 1.8 cm mass in the lower outer quadrant of the right breast. Recommend further evaluation with mammography and ultrasound if not previously performed. 3. Wedge-shaped soft tissue in the anterior mediastinum with some fatty stippling, favored to reflect residual thymus. 4. Hepatic steatosis. Critical Value/emergent results were called by telephone at the time of interpretation on 07/13/2020 at 1:02 am to provider PA McDonald , who verbally acknowledged these results. Electronically Signed   By: Kreg Shropshire M.D.   On: 07/13/2020 01:03    EKG: Independently reviewed.   Assessment/Plan Principal Problem:   Acute saddle pulmonary embolism [facility]) Patient admitted to stepdown unit with continuous telemetry monitoring.  Patient not in acute respiratory distress.  Oxygen supplementation with nasal cannula if needed.  Continue heparin drip. Echocardiogram ordered to check for right heart strain.  Cardiology consult will be ordered if needed Duplex venous ultrasound of bilateral lower extremities ordered to rule out DVT IR consult ordered for their recommendations Pain medications according to the pain scale as needed  Active Problems:   Foot fracture, left Stable  Asthma Stable.  Albuterol inhaler as needed  Obesity Patient counseled about healthy lifestyle  (please populate well all problems here in Problem List. (For example, if patient is on BP meds at home and you resume or decide to hold them, it is a problem that needs to be  her. Same for CAD, COPD, HLD and so on)     DVT prophylaxis: Patient on heparin drip Code Status: Full code Family Communication: Patient's mother present at the bedside Disposition Plan: Home Consults called: IR consult ordered Admission status: Inpatient/stepdown   Thalia Party MD [facility] Pager [phone]   If 7PM-7AM, please contact night-coverage [email or web address] Password [facility]  07/13/2020, 6:51 AM

## 2020-07-13 NOTE — Progress Notes (Signed)
PROGRESS NOTE    Joy Boyer  QMV:[id number] DOB: 1986-08-16 DOA: 07/12/2020 PCP: Pcp, No    Brief Narrative:  34 y.o. female with medical history significant of asthma and recent fracture of her right foot presented to ED for evaluation of worsening shortness of breath.  Patient states that she started having sudden onset of shortness of breath and exertional dyspnea around 5 PM yesterday.  Patient used her albuterol inhaler but it did not help with shortness of breath and she decided to visit the ED for evaluation.  Patient states that she was recently had a fracture of her left foot and she is using wheelchair, walker and crutches for the last 3 weeks.  Assessment & Plan:   Principal Problem:   Acute saddle pulmonary embolism (HCC) Active Problems:   Foot fracture, left  Principal Problem:   Acute saddle pulmonary embolism (HCC) -O2 sats and vital signs stable -CT personally reviewed. Findings of saddle PE involving L and R pulm arteries with heart strain on CT -2d echo performed and reviewed. RV systolic function is normal -LE dopplers obtained and reviewed, findings of LLE DVT -Appreciate input by IR. Recommendation to continue heparin gtt for now -If condition changes or pt becomes unstable, would consult CCM at that time  Active Problems:   Foot fracture prior to admit -Followed by Podiatry as outpatient -Foot orthotic currently in place -Consider PT eval in 48-72hrs if stable from PE standpoint  Asthma -Remains stable, on room air  Obesity -Recommend diet/lifestyle modification  DVT prophylaxis: Heparin subq Code Status: Full Family Communication: Pt in room, family not at bedside  Status is: Inpatient  Remains inpatient appropriate because:Ongoing diagnostic testing needed not appropriate for outpatient work up, IV treatments appropriate due to intensity of illness or inability to take PO and Inpatient level of care appropriate due to severity of  illness   Dispo: The patient is from: Home              Anticipated d/c is to: Home              Anticipated d/c date is: 3 days              Patient currently is not medically stable to d/c.       Consultants:   IR  Procedures:     Antimicrobials: Anti-infectives (From admission, onward)   None       Subjective: Denies chest pain or sob  Objective: Vitals:   07/13/20 0954 07/13/20 1000 07/13/20 1100 07/13/20 1306  BP:  (!) 183/132 (!) 166/122   Pulse:  (!) 105 (!) 102   Resp:  18 16   Temp:    98.2 F (36.8 C)  TempSrc:    Oral  SpO2:  98% 97%   Weight: 101.2 kg     Height:  (1.575 m)       Intake/Output Summary (Last 24 hours) at 07/13/2020 1424 Last data filed at 07/13/2020 0800 Gross per 24 hour  Intake 121.81 ml  Output 0 ml  Net 121.81 ml   Filed Weights   07/13/20 0900 07/13/20 0954  Weight: 90.7 kg 101.2 kg    Examination:  General exam: Appears calm and comfortable  Respiratory system: Clear to auscultation. Respiratory effort normal. Cardiovascular system: S1 & S2 heard, Regular Gastrointestinal system: Abdomen is nondistended,soft and nontender. No organomegaly or masses felt. Normal bowel sounds heard . Central nervous system: Alert and oriented. No focal neurological deficits. Extremities: Symmetric 5  x 5 power. Skin: No rashes, lesions  Psychiatry: Judgement and insight appear normal. Mood & affect appropriate.   Data Reviewed: I have personally reviewed following labs and imaging studies  CBC: Recent Labs  Lab 07/13/20 0005 07/13/20 0036 07/13/20 0822  WBC 11.9*  --  11.2*  NEUTROABS 6.7  --   --   HGB 15.0 15.3* 14.9  HCT 46.3* 45.0 47.6*  MCV 82.7  --  85.8  PLT 353  --  345   Basic Metabolic Panel: Recent Labs  Lab 07/13/20 0036 07/13/20 0822  NA 143 138  K 3.9 4.2  CL 106 107  CO2  --  16*  GLUCOSE 130* 118*  BUN 13 10  CREATININE 0.60 0.67  CALCIUM  --  9.4   GFR: Estimated Creatinine Clearance:  108.2 mL/min (by C-G formula based on SCr of 0.67 mg/dL). Liver Function Tests: Recent Labs  Lab 07/13/20 0822  AST 24  ALT 21  ALKPHOS 59  BILITOT 0.5  PROT 8.4*  ALBUMIN 3.9   No results for input(s): LIPASE, AMYLASE in the last 168 hours. No results for input(s): AMMONIA in the last 168 hours. Coagulation Profile: Recent Labs  Lab 07/13/20 0209  INR 1.0   Cardiac Enzymes: No results for input(s): CKTOTAL, CKMB, CKMBINDEX, TROPONINI in the last 168 hours. BNP (last 3 results) No results for input(s): PROBNP in the last 8760 hours. HbA1C: No results for input(s): HGBA1C in the last 72 hours. CBG: No results for input(s): GLUCAP in the last 168 hours. Lipid Profile: No results for input(s): CHOL, HDL, LDLCALC, TRIG, CHOLHDL, LDLDIRECT in the last 72 hours. Thyroid Function Tests: No results for input(s): TSH, T4TOTAL, FREET4, T3FREE, THYROIDAB in the last 72 hours. Anemia Panel: No results for input(s): VITAMINB12, FOLATE, FERRITIN, TIBC, IRON, RETICCTPCT in the last 72 hours. Sepsis Labs: No results for input(s): PROCALCITON, LATICACIDVEN in the last 168 hours.  Recent Results (from the past 240 hour(s))  Respiratory Panel by RT PCR (Flu A&B, Covid) - Nasopharyngeal Swab     Status: None   Collection Time: 07/13/20 12:07 AM   Specimen: Nasopharyngeal Swab  Result Value Ref Range Status   SARS Coronavirus 2 by RT PCR NEGATIVE NEGATIVE Final    Comment: (NOTE) SARS-CoV-2 target nucleic acids are NOT DETECTED.  The SARS-CoV-2 RNA is generally detectable in upper respiratoy specimens during the acute phase of infection. The lowest concentration of SARS-CoV-2 viral copies this assay can detect is 131 copies/mL. A negative result does not preclude SARS-Cov-2 infection and should not be used as the sole basis for treatment or other patient management decisions. A negative result may occur with  improper specimen collection/handling, submission of specimen other than  nasopharyngeal swab, presence of viral mutation(s) within the areas targeted by this assay, and inadequate number of viral copies (<131 copies/mL). A negative result must be combined with clinical observations, patient history, and epidemiological information. The expected result is Negative.  Fact Sheet for Patients:  https://www.moore.com/https://www.fda.gov/media/142436/download  Fact Sheet for Healthcare Providers:  https://www.young.biz/https://www.fda.gov/media/142435/download  This test is no t yet approved or cleared by the [place] FDA and  has been authorized for detection and/or diagnosis of SARS-CoV-2 by FDA under an Emergency Use Authorization (EUA). This EUA will remain  in effect (meaning this test can be used) for the duration of the COVID-19 declaration under Section 564(b)(1) of the Act, 21 U.S.C. section 360bbb-3(b)(1), unless the authorization is terminated or revoked sooner.     Influenza A by  PCR NEGATIVE NEGATIVE Final   Influenza B by PCR NEGATIVE NEGATIVE Final    Comment: (NOTE) The Xpert Xpress SARS-CoV-2/FLU/RSV assay is intended as an aid in  the diagnosis of influenza from Nasopharyngeal swab specimens and  should not be used as a sole basis for treatment. Nasal washings and  aspirates are unacceptable for Xpert Xpress SARS-CoV-2/FLU/RSV  testing.  Fact Sheet for Patients: https://www.moore.com/  Fact Sheet for Healthcare Providers: https://www.young.biz/  This test is not yet approved or cleared by the [place] FDA and  has been authorized for detection and/or diagnosis of SARS-CoV-2 by  FDA under an Emergency Use Authorization (EUA). This EUA will remain  in effect (meaning this test can be used) for the duration of the  Covid-19 declaration under Section 564(b)(1) of the Act, 21  U.S.C. section 360bbb-3(b)(1), unless the authorization is  terminated or revoked. Performed at [facility], [address], [place], [place] [address]   MRSA PCR Screening     Status: None   Collection Time: 07/13/20  5:19 AM   Specimen: Nasopharyngeal  Result Value Ref Range Status   MRSA by PCR NEGATIVE NEGATIVE Final    Comment:        The GeneXpert MRSA Assay (FDA approved for NASAL specimens only), is one component of a comprehensive MRSA colonization surveillance program. It is not intended to diagnose MRSA infection nor to guide or monitor treatment for MRSA infections. Performed at [facility], [address] [place], [place] [address]      Radiology Studies: DG Chest 2 View  Result Date: 07/13/2020 CLINICAL DATA:  Tachycardia, shortness of breath EXAM: CHEST - 2 VIEW COMPARISON:  None. FINDINGS: Low lung volumes with vascular crowding. Some hazy opacity in the right lung base adjacent the asymmetrically elevated right hemidiaphragm is favored to reflect atelectasis without other focal opacity, convincing features of edema, pneumothorax or effusion. The cardiomediastinal contours are unremarkable. Telemetry leads overlie the chest. No acute osseous or soft tissue abnormality. IMPRESSION: Low lung volumes with vascular crowding and likely right basilar atelectasis. Electronically Signed   By: Kreg Shropshire M.D.   On: 07/13/2020 00:41   CT Angio Chest PE W/Cm &/Or Wo Cm  Result Date: 07/13/2020 CLINICAL DATA:  Cough, recently broke foot with increasing exertion, PE suspected, suspected asthma attack but without relief with inhaler. EXAM: CT ANGIOGRAPHY CHEST WITH CONTRAST TECHNIQUE: Multidetector CT imaging of the chest was performed using the standard protocol during bolus administration of intravenous contrast. Multiplanar CT image reconstructions and MIPs were obtained to evaluate the vascular anatomy. CONTRAST:  OMNIPAQUE IOHEXOL 350 MG/ML SOLN COMPARISON:  Radiograph 07/13/2020 FINDINGS: Cardiovascular: Satisfactory opacification of the pulmonary arteries with evaluation  slightly limited by some some respiratory motion artifact which more pronounced towards the lung bases. There is however visible saddle pulmonary embolus extending between the left and right main stem bronchus and into the lobar and segmental branches of the right upper, middle and both lower lobes as well as into the lingular segmental branches as well. Flattening of the intraventricular septum with elevation of the RV/LV ratio to 1.15. Slight reflux of contrast into the IVC as well. Cardiac size is otherwise within normal limits. The aortic root is suboptimally assessed given cardiac pulsation artifact. The aorta is normal caliber. No acute luminal abnormality of the imaged aorta. No periaortic stranding or hemorrhage. Mediastinum/Nodes: Wedge-shaped soft tissue seen on in the anterior mediastinum with some fatty stippling. No mediastinal fluid or gas.  Normal thyroid gland and thoracic inlet. No acute abnormality of the trache the ankle thanks in native distal DA given the colon anise nasally a or esophagus. No worrisome mediastinal, hilar or axillary adenopathy. Lungs/Pleura: Evaluation lung parenchyma limited by extensive respiratory motion artifact. No consolidation, features of edema, pneumothorax, or effusion. No suspicious pulmonary nodules or masses. Upper Abdomen: No acute abnormalities present in the visualized portions of the upper abdomen. Diffuse hepatic hypoattenuation compatible with hepatic steatosis. Sparing along the gallbladder fossa. Musculoskeletal: No acute osseous abnormality or suspicious osseous lesions. There is a ovoid 1.8 cm mass in the lower outer quadrant of the right breast (4/65). No other worrisome or conspicuous chest wall lesions are evident. Review of the MIP images confirms the above findings. IMPRESSION: 1. Saddle pulmonary embolus extending between the left and right main stem bronchus and into the lobar and segmental branches of the right upper, middle and both lower lobes as  well as into the lingular segmental branches as well. Positive for acute PE with CT evidence of right heart strain (RV/LV Ratio 1.15) consistent with at least submassive (intermediate risk) PE. The presence of right heart strain has been associated with an increased risk of morbidity and mortality. 2. Ovoid 1.8 cm mass in the lower outer quadrant of the right breast. Recommend further evaluation with mammography and ultrasound if not previously performed. 3. Wedge-shaped soft tissue in the anterior mediastinum with some fatty stippling, favored to reflect residual thymus. 4. Hepatic steatosis. Critical Value/emergent results were called by telephone at the time of interpretation on 07/13/2020 at 1:02 am to provider PA McDonald , who verbally acknowledged these results. Electronically Signed   By: Kreg Shropshire M.D.   On: 07/13/2020 01:03   ECHOCARDIOGRAM COMPLETE  Result Date: 07/13/2020    ECHOCARDIOGRAM REPORT   Patient Name:   Joy Boyer Date of Exam: 07/13/2020 Medical Rec #:  [id number]    Height:       62.0 in Accession #:    [id number]   Weight:       223.1 lb Date of Birth:  1986/06/30    BSA:          2.003 m Patient Age:    34 years     BP:           167/121 mmHg Patient Gender: F            HR:           106 bpm. Exam Location:  Inpatient Procedure: 2D Echo and Intracardiac Opacification Agent Indications:    Pulmonary Embolus 415.19 / I26.99  History:        Patient has no prior history of Echocardiogram examinations.  Sonographer:    Leta Jungling RDCS Referring Phys: [id number] [facility] KHAN IMPRESSIONS  1. Left ventricular ejection fraction, by estimation, is 60 to 65%. The left ventricle has normal function. The left ventricle has no regional wall motion abnormalities. Left ventricular diastolic parameters are consistent with Grade I diastolic dysfunction (impaired relaxation).  2. Right ventricular systolic function is normal. The right ventricular size is normal. There is normal pulmonary artery  systolic pressure.  3. The mitral valve was not well visualized. No evidence of mitral valve regurgitation. No evidence of mitral stenosis.  4. The aortic valve has an indeterminant number of cusps. Aortic valve regurgitation is not visualized. No aortic stenosis is present. FINDINGS  Left Ventricle: Left ventricular ejection fraction, by estimation, is 60 to 65%. The left ventricle has normal  function. The left ventricle has no regional wall motion abnormalities. Definity contrast agent was given IV to delineate the left ventricular  endocardial borders. The left ventricular internal cavity size was normal in size. There is no left ventricular hypertrophy. Left ventricular diastolic parameters are consistent with Grade I diastolic dysfunction (impaired relaxation). Normal left ventricular filling pressure. Right Ventricle: The right ventricular size is normal. No increase in right ventricular wall thickness. Right ventricular systolic function is normal. There is normal pulmonary artery systolic pressure. The tricuspid regurgitant velocity is 2.20 m/s, and  with an assumed right atrial pressure of 3 mmHg, the estimated right ventricular systolic pressure is 22.4 mmHg. Left Atrium: Left atrial size was normal in size. Right Atrium: Right atrial size was normal in size. Pericardium: There is no evidence of pericardial effusion. Mitral Valve: The mitral valve was not well visualized. No evidence of mitral valve regurgitation. No evidence of mitral valve stenosis. Tricuspid Valve: The tricuspid valve is normal in structure. Tricuspid valve regurgitation is mild . No evidence of tricuspid stenosis. Aortic Valve: The aortic valve has an indeterminant number of cusps. Aortic valve regurgitation is not visualized. No aortic stenosis is present. Aortic valve mean gradient measures 3.6 mmHg. Aortic valve peak gradient measures 6.6 mmHg. Aortic valve area, by VTI measures 2.45 cm. Pulmonic Valve: The pulmonic valve was not  well visualized. Pulmonic valve regurgitation is not visualized. No evidence of pulmonic stenosis. Aorta: The aortic root is normal in size and structure. Pulmonary Artery: Indeterminant PASP, IVC poorly visualized. Venous: The inferior vena cava was not well visualized. IAS/Shunts: The interatrial septum was not well visualized.  LEFT VENTRICLE PLAX 2D LVIDd:         3.71 cm     Diastology LVIDs:         2.45 cm     LV e' medial:    10.40 cm/s LV PW:         1.05 cm     LV E/e' medial:  5.7 LV IVS:        0.94 cm     LV e' lateral:   10.30 cm/s LVOT diam:     1.90 cm     LV E/e' lateral: 5.8 LV SV:         46 LV SV Index:   23 LVOT Area:     2.84 cm  LV Volumes (MOD) LV vol d, MOD A2C: 79.7 ml LV vol d, MOD A4C: 76.6 ml LV vol s, MOD A2C: 30.1 ml LV vol s, MOD A4C: 28.4 ml LV SV MOD A2C:     49.6 ml LV SV MOD A4C:     76.6 ml LV SV MOD BP:      49.7 ml LEFT ATRIUM             Index LA diam:        2.20 cm 1.10 cm/m LA Vol (A2C):   16.6 ml 8.29 ml/m LA Vol (A4C):   22.9 ml 11.44 ml/m LA Biplane Vol: 19.7 ml 9.84 ml/m  AORTIC VALVE AV Area (Vmax):    2.75 cm AV Area (Vmean):   2.34 cm AV Area (VTI):     2.45 cm AV Vmax:           127.99 cm/s AV Vmean:          90.095 cm/s AV VTI:            0.187 m AV Peak Grad:  6.6 mmHg AV Mean Grad:      3.6 mmHg LVOT Vmax:         124.24 cm/s LVOT Vmean:        74.361 cm/s LVOT VTI:          0.161 m LVOT/AV VTI ratio: 0.86  AORTA Ao Root diam: 2.80 cm MITRAL VALVE               TRICUSPID VALVE MV Area (PHT): 8.92 cm    TR Peak grad:   19.4 mmHg MV Decel Time: 85 msec     TR Vmax:        220.00 cm/s MV E velocity: 59.70 cm/s MV A velocity: 77.10 cm/s  SHUNTS MV E/A ratio:  0.77        Systemic VTI:  0.16 m                            Systemic Diam: 1.90 cm Dina Rich MD Electronically signed by Dina Rich MD Signature Date/Time: 07/13/2020/12:32:26 PM    Final    VAS [place] LOWER EXTREMITY VENOUS (DVT)  Result Date: 07/13/2020  Lower Venous DVT Study  Indications: Saddle pulmonary embolism. Left fifth metatarsal fracture x3 weeks with left calf cramping approximately 3 weeks ago. Non-weight bearing precautions on LLE.  Comparison Study: No prior study Performing Technologist: Gertie Fey MHA, RDMS, RVT, RDCS  Examination Guidelines: A complete evaluation includes B-mode imaging, spectral Doppler, color Doppler, and power Doppler as needed of all accessible portions of each vessel. Bilateral testing is considered an integral part of a complete examination. Limited examinations for reoccurring indications may be performed as noted. The reflux portion of the exam is performed with the patient in reverse Trendelenburg.  +---------+---------------+---------+-----------+----------+--------------+ RIGHT    CompressibilityPhasicitySpontaneityPropertiesThrombus Aging +---------+---------------+---------+-----------+----------+--------------+ CFV      Full           No       Yes                                 +---------+---------------+---------+-----------+----------+--------------+ SFJ      Full                                                        +---------+---------------+---------+-----------+----------+--------------+ FV Prox  Full                                                        +---------+---------------+---------+-----------+----------+--------------+ FV Mid   Full                                                        +---------+---------------+---------+-----------+----------+--------------+ FV DistalFull                                                        +---------+---------------+---------+-----------+----------+--------------+  PFV      Full                                                        +---------+---------------+---------+-----------+----------+--------------+ POP      Full           No       Yes                                  +---------+---------------+---------+-----------+----------+--------------+ PTV      Full                                                        +---------+---------------+---------+-----------+----------+--------------+ PERO     Full                                                        +---------+---------------+---------+-----------+----------+--------------+   +---------+---------------+---------+-----------+----------+--------------+ LEFT     CompressibilityPhasicitySpontaneityPropertiesThrombus Aging +---------+---------------+---------+-----------+----------+--------------+ CFV      Full           No       Yes                                 +---------+---------------+---------+-----------+----------+--------------+ SFJ      Full                                                        +---------+---------------+---------+-----------+----------+--------------+ FV Prox  Full                                                        +---------+---------------+---------+-----------+----------+--------------+ FV Mid   Full                                                        +---------+---------------+---------+-----------+----------+--------------+ FV DistalFull                                                        +---------+---------------+---------+-----------+----------+--------------+ PFV      Full                                                        +---------+---------------+---------+-----------+----------+--------------+  POP      Full           No       Yes                                 +---------+---------------+---------+-----------+----------+--------------+ PTV      None                    No                   Acute          +---------+---------------+---------+-----------+----------+--------------+ PERO     None                    No                   Acute           +---------+---------------+---------+-----------+----------+--------------+     Summary: RIGHT: - There is no evidence of deep vein thrombosis in the lower extremity.  - No cystic structure found in the popliteal fossa.  LEFT: - Findings consistent with acute deep vein thrombosis involving the left posterior tibial veins, and left peroneal veins. - No cystic structure found in the popliteal fossa.  Bilateral lower extremity venous flow is pulsatile, suggestive of possibly elevated right heart pressure.  *See table(s) above for measurements and observations.    Preliminary     Scheduled Meds: . Chlorhexidine Gluconate Cloth  6 each Topical Daily   Continuous Infusions: . heparin 1,400 Units/hr (07/13/20 0956)     LOS: 0 days   Rickey Barbara, MD [facility] Pager On Amion  If 7PM-7AM, please contact night-coverage 07/13/2020, 2:24 PM

## 2020-07-13 NOTE — ED Notes (Signed)
EDP at bedside  

## 2020-07-13 NOTE — Progress Notes (Signed)
   Hx COPD/Asthma Increasing SOB since yesterday 5pm To ED at [facility]  CTA: IMPRESSION: 1. Saddle pulmonary embolus extending between the left and right main stem bronchus and into the lobar and segmental branches of the right upper, middle and both lower lobes as well as into the lingular segmental branches as well. Positive for acute PE with CT evidence of right heart strain (RV/LV Ratio 1.15) consistent with at least submassive (intermediate risk) PE. The presence of right heart strain has been associated with an increased risk of morbidity and mortality. 2. Ovoid 1.8 cm mass in the lower outer quadrant of the right breast. Recommend further evaluation with mammography and ultrasound if not previously performed. 3. Wedge-shaped soft tissue in the anterior mediastinum with some fatty stippling, favored to reflect residual thymus  HR 105; 98% RA; R 16; 169/92; 98.1 On heparin now D dimer 8.3  Dr Welton Flakes note: Acute saddle pulmonary embolism [facility]) Patient admitted to stepdown unit with continuous telemetry monitoring.  Patient not in acute respiratory distress.  Oxygen supplementation with nasal cannula if needed.  Continue heparin drip  Request for IR consult Discussed with Dr Miles Costain With pt on Heparin and in NAD; Vitals wnl; thin- non occlusive PE--- Not a candidate for Lysis at this time. Rec: continue Heparin for now. We will keep on IR Radar; will watch for Echo and doppler result. If symptoms increase or change- would recommend CCM consult and contact IR for possible involvement.  Dr Rhona Leavens aware and agreeable

## 2020-07-13 NOTE — Progress Notes (Signed)
ANTICOAGULATION CONSULT NOTE - Initial Consult  Pharmacy Consult for heparin Indication: pulmonary embolus  Allergies  Allergen Reactions  . Penicillins Rash    Arms and chest. Has tolerated amoxicillin and Augmentin many times since then without difficulty.  . Sulfa Antibiotics Other (See Comments)    Behavior change.  Became agitated and angry.    Patient Measurements:   Heparin Dosing Weight: 71kg  Vital Signs: Temp: 97.7 F (36.5 C) (11/06 2352) Temp Source: Oral (11/06 2352) BP: 169/128 (11/07 0130) Pulse Rate: 125 (11/07 0130)  Labs: Recent Labs    07/13/20 0005 07/13/20 0036  HGB 15.0 15.3*  HCT 46.3* 45.0  PLT 353  --   CREATININE  --  0.60    CrCl cannot be calculated (Unknown ideal weight.).   Medical History: Past Medical History:  Diagnosis Date  . Acne   . Asthma   . Hx of migraines      Assessment: 34 yo female present with SOB and wheezing.  Pt has asthma but hasn't had a flareup in years.  Pt is recovering from a fracture of her foot, has been nonweightbearing for the past 3 weeks.  Pharmacy consulted to dose heparin for PE.  No prior AC noted  07/13/2020 Hgb 15.3 plts 353 DDimer 8.3 Obtain baseline aPTT and pt/inr  Goal of Therapy:  Heparin level 0.3-0.7 units/ml Monitor platelets by anticoagulation protocol   Plan:  Heparin bolus 4000 units x 1 then start heparin drip at 1200 units/hr Heparin level in 6 hours Daily CBC  Arley Phenix RPh 07/13/2020, 2:17 AM

## 2020-07-13 NOTE — Progress Notes (Signed)
ANTICOAGULATION CONSULT NOTE - Follow Up Consult  Pharmacy Consult for Heparin Indication: pulmonary embolus  Allergies  Allergen Reactions  . Penicillins Rash    Arms and chest. Has tolerated amoxicillin and Augmentin many times since then without difficulty.  . Sulfa Antibiotics Other (See Comments)    Behavior change.  Became agitated and angry.    Patient Measurements:    Height: 62 inches, Weight 90.7 kg (07/08/2020) Heparin Dosing Weight: 71 kg  Vital Signs: Temp: 98.1 F (36.7 C) (11/07 0828) Temp Source: Oral (11/07 0828) BP: 169/92 (11/07 0800) Pulse Rate: 105 (11/07 0800)  Labs: Recent Labs    07/13/20 0005 07/13/20 0005 07/13/20 0036 07/13/20 0209 07/13/20 0822  HGB 15.0   < > 15.3*  --  14.9  HCT 46.3*  --  45.0  --  47.6*  PLT 353  --   --   --  345  APTT  --   --   --  30  --   LABPROT  --   --   --  12.8  --   INR  --   --   --  1.0  --   HEPARINUNFRC  --   --   --   --  0.15*  CREATININE  --   --  0.60  --  0.67   < > = values in this interval not displayed.    CrCl cannot be calculated (Unknown ideal weight.).   Medications:  Scheduled:  . Chlorhexidine Gluconate Cloth  6 each Topical Daily   Infusions:  . heparin 1,200 Units/hr (07/13/20 0800)   PRN: acetaminophen **OR** acetaminophen, albuterol, HYDROcodone-acetaminophen, labetalol, morphine injection, ondansetron **OR** ondansetron (ZOFRAN) IV  Assessment: 34 yo female present with SOB and wheezing. Pt is recovering from a fracture of her foot, has been nonweightbearing for the past 3 weeks.  Pharmacy consulted to dose heparin for PE.  No prior AC noted.  Today, 07/13/2020  Heparin level subtherapeutic following 4000 unit bolus and 1200 units/hr infusion  CBC stable  No bleeding or complications with IV documented  Goal of Therapy:  Heparin level 0.3-0.7 units/ml Monitor platelets by anticoagulation protocol: Yes   Plan:   Heparin IV bolus 2000 units  Increase Heparin IV  infusion to 1400 units/hr  Recheck heparin level in 6hrs  Daily heparin level and CBC  Monitor for signs/symptoms of bleeding  Loralee Pacas, PharmD, BCPS Pharmacy: [phone] 07/13/2020,9:42 AM

## 2020-07-13 NOTE — ED Provider Notes (Signed)
[facility]-EMERGENCY DEPT Provider Note   CSN: [id number] Arrival date & time: 07/12/20  2340   Time seen 11:55 PM  History Chief Complaint  Patient presents with   Asthma    Joy Boyer is a 34 y.o. female.  HPI   Patient reports about 5 PM she started getting short of breath and having some wheezing.  She denies fever, sore throat, change in her chronic allergic rhinorrhea, heaviness states she has had a mild she states she had a albuterol inhaler that had expired that she used about 5 PM.  She used 2 puffs with mild improvement.  Patient states she has not had a flareup of her asthma in several years.  She states she normally is put on prednisone.  She is never had to be admitted.  Patient does not smoke.  She is not on oxygen at home.  Patient states she is currently recovering from a fracture of her foot.  She has been nonweightbearing for the past 3 weeks and is using a wheelchair, walker, and crutches.  She states that she has another 3 weeks of nonweightbearing and hopefully can avoid surgical repair.  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 No pertinent surgical history.   OB History   No obstetric history on file.     Family History  Problem Relation Age of Onset   Hyperlipidemia Mother    Hypertension Mother    Mental illness Father    Cancer Father    Diabetes Father    Heart disease Father    Cancer Maternal Aunt    Mental illness Paternal Uncle    Heart disease Paternal Uncle    Mental retardation Maternal Grandmother    COPD Paternal Grandmother    Cancer Paternal Grandfather     Social History   Tobacco Use   Smoking status: Never Smoker   Smokeless tobacco: Never Used  Substance Use Topics    Alcohol use: Yes    Comment: rare   Drug use: Never  Patient lives in [place], she is currently staying with her mother due to her fracture and need for extra help.  Home Medications Prior to Admission medications   Medication Sig Start Date End Date Taking? Authorizing Provider  albuterol (PROVENTIL HFA;VENTOLIN HFA) 108 (90 BASE) MCG/ACT inhaler Inhale 2 puffs into the lungs every 6 (six) hours as needed for wheezing or shortness of breath. 09/30/14  Yes Elvina Sidle, MD  estradiol (CLIMARA - DOSED IN MG/24 HR) 0.05 mg/24hr patch PLACE 1 PATCH ONTO THE SKIN ONCE A WEEK. 11/26/19 11/25/20 Yes [provider]  ibuprofen (ADVIL) 200 MG tablet Take 200 mg by mouth 2 (two) times daily.   Yes [provider]  ibuprofen (ADVIL) 800 MG tablet Take by mouth. 06/20/20 07/20/20 Yes [provider]  loratadine (CLARITIN) 10 MG tablet Take 10 mg by mouth daily.   Yes [provider]  Norethindrone Acetate-Ethinyl Estrad-FE (LOESTRIN 24 FE) 1-20 MG-MCG(24) tablet Take 1 tablet by mouth daily. 11/26/19 11/25/20 Yes [provider]  chlorpheniramine-HYDROcodone (TUSSIONEX PENNKINETIC ER) 10-8 MG/5ML SUER Take 5 mLs by mouth every 12 (twelve) hours as needed for cough. Patient not taking: Reported on 07/13/2020 09/13/16  McVey, Madelaine Bhat, PA-C  predniSONE (DELTASONE) 20 MG tablet Take 3 PO QAM x3days, 2 PO QAM x3days, 1 PO QAM x3days Patient not taking: Reported on 07/13/2020 09/13/16   McVey, Madelaine Bhat, PA-C    Allergies    Penicillins and Sulfa antibiotics  Review of Systems   Review of Systems  All other systems reviewed and are negative.   Physical Exam Updated Vital Signs BP (!) 204/120 (BP Location: Left Arm)    Pulse (!) 121    Temp (!) 97 F (36.1 C)    Resp 20    SpO2 98%   Physical Exam Vitals and nursing note reviewed.  Constitutional:      General: She is in acute distress.     Appearance: Normal appearance. She is obese.   HENT:     Head: Normocephalic and atraumatic.     Right Ear: External ear normal.     Left Ear: External ear normal.  Eyes:     Extraocular Movements: Extraocular movements intact.     Conjunctiva/sclera: Conjunctivae normal.     Pupils: Pupils are equal, round, and reactive to light.  Cardiovascular:     Rate and Rhythm: Regular rhythm. Tachycardia present.  Pulmonary:     Effort: Tachypnea and accessory muscle usage present.     Breath sounds: Decreased breath sounds present.  Musculoskeletal:     Cervical back: Normal range of motion.     Comments: Patient has a cam walker on her left leg  Skin:    General: Skin is warm and dry.  Neurological:     General: No focal deficit present.     Mental Status: She is alert and oriented to person, place, and time.     Cranial Nerves: No cranial nerve deficit.  Psychiatric:        Mood and Affect: Mood normal.        Behavior: Behavior normal.        Thought Content: Thought content normal.     ED Results / Procedures / Treatments   Labs (all labs ordered are listed, but only abnormal results are displayed) Results for orders placed or performed during the hospital encounter of 07/12/20  Respiratory Panel by RT PCR (Flu A&B, Covid) - Nasopharyngeal Swab   Specimen: Nasopharyngeal Swab  Result Value Ref Range   SARS Coronavirus 2 by RT PCR NEGATIVE NEGATIVE   Influenza A by PCR NEGATIVE NEGATIVE   Influenza B by PCR NEGATIVE NEGATIVE  D-dimer, quantitative  Result Value Ref Range   D-Dimer, Quant 8.30 (H) 0.00 - 0.50 ug/mL-FEU  CBC with Differential  Result Value Ref Range   WBC 11.9 (H) 4.0 - 10.5 K/uL   RBC 5.60 (H) 3.87 - 5.11 MIL/uL   Hemoglobin 15.0 12.0 - 15.0 g/dL   HCT 16.1 (H) 36 - 46 %   MCV 82.7 80.0 - 100.0 fL   MCH 26.8 26.0 - 34.0 pg   MCHC 32.4 30.0 - 36.0 g/dL   RDW 09.6 04.5 - 40.9 %   Platelets 353 150 - 400 K/uL   nRBC 0.0 0.0 - 0.2 %   Neutrophils Relative % 57 %   Neutro Abs 6.7 1.7 - 7.7 K/uL    Lymphocytes Relative 35 %   Lymphs Abs 4.2 (H) 0.7 - 4.0 K/uL   Monocytes Relative 6 %   Monocytes Absolute 0.7 0.1 - 1.0 K/uL   Eosinophils Relative 1 %   Eosinophils Absolute 0.2 0.0 - 0.5 K/uL  Basophils Relative 1 %   Basophils Absolute 0.1 0.0 - 0.1 K/uL   Immature Granulocytes 0 %   Abs Immature Granulocytes 0.02 0.00 - 0.07 K/uL  APTT  Result Value Ref Range   aPTT 30 24 - 36 seconds  Protime-INR  Result Value Ref Range   Prothrombin Time 12.8 11.4 - 15.2 seconds   INR 1.0 0.8 - 1.2  I-stat chem 8, ED (not at [facility] or [facility])  Result Value Ref Range   Sodium 143 135 - 145 mmol/L   Potassium 3.9 3.5 - 5.1 mmol/L   Chloride 106 98 - 111 mmol/L   BUN 13 6 - 20 mg/dL   Creatinine, Ser 5.62 0.44 - 1.00 mg/dL   Glucose, Bld 563 (H) 70 - 99 mg/dL   Calcium, Ion 8.93 7.34 - 1.40 mmol/L   TCO2 21 (L) 22 - 32 mmol/L   Hemoglobin 15.3 (H) 12.0 - 15.0 g/dL   HCT 28.7 36 - 46 %  I-Stat beta hCG blood, ED  Result Value Ref Range   I-stat hCG, quantitative <5.0 <5 mIU/mL   Comment 3           Laboratory interpretation all normal except very elevated ddimer, mild leukocytosis    EKG EKG Interpretation  Date/Time:  Sunday July 13 2020 00:07:02 EDT Ventricular Rate:  126 PR Interval:    QRS Duration: 85 QT Interval:  320 QTC Calculation: 464 R Axis:   41 Text Interpretation: Sinus tachycardia Otherwise within normal limits No old tracing to compare Confirmed by Devoria Albe ([id number]) on 07/13/2020 12:49:20 AM   Radiology DG Chest 2 View  Result Date: 07/13/2020 CLINICAL DATA:  Tachycardia, shortness of breath EXAM: CHEST - 2 VIEW COMPARISON:  None. FINDINGS: Low lung volumes with vascular crowding. Some hazy opacity in the right lung base adjacent the asymmetrically elevated right hemidiaphragm is favored to reflect atelectasis without other focal opacity, convincing features of edema, pneumothorax or effusion. The cardiomediastinal contours are unremarkable. Telemetry leads  overlie the chest. No acute osseous or soft tissue abnormality. IMPRESSION: Low lung volumes with vascular crowding and likely right basilar atelectasis. Electronically Signed   By: Kreg Shropshire M.D.   On: 07/13/2020 00:41   CT Angio Chest PE W/Cm &/Or Wo Cm  Result Date: 07/13/2020 CLINICAL DATA:  Cough, recently broke foot with increasing exertion, PE suspected, suspected asthma attack but without relief with inhaler. EXAM: CT ANGIOGRAPHY CHEST WITH CONTRAST TECHNIQUE: Multidetector CT imaging of the chest was performed using the standard protocol during bolus administration of intravenous contrast. Multiplanar CT image reconstructions and MIPs were obtained to evaluate the vascular anatomy. CONTRAST:  OMNIPAQUE IOHEXOL 350 MG/ML SOLN COMPARISON:  Radiograph 07/13/2020 FINDINGS: Cardiovascular: Satisfactory opacification of the pulmonary arteries with evaluation slightly limited by some some respiratory motion artifact which more pronounced towards the lung bases. There is however visible saddle pulmonary embolus extending between the left and right main stem bronchus and into the lobar and segmental branches of the right upper, middle and both lower lobes as well as into the lingular segmental branches as well. Flattening of the intraventricular septum with elevation of the RV/LV ratio to 1.15. Slight reflux of contrast into the IVC as well. Cardiac size is otherwise within normal limits. The aortic root is suboptimally assessed given cardiac pulsation artifact. The aorta is normal caliber. No acute luminal abnormality of the imaged aorta. No periaortic stranding or hemorrhage. Mediastinum/Nodes: Wedge-shaped soft tissue seen on in the anterior mediastinum with some fatty  stippling. No mediastinal fluid or gas. Normal thyroid gland and thoracic inlet. No acute abnormality of the trache the ankle thanks in native distal DA given the colon anise nasally a or esophagus. No worrisome mediastinal, hilar or  axillary adenopathy. Lungs/Pleura: Evaluation lung parenchyma limited by extensive respiratory motion artifact. No consolidation, features of edema, pneumothorax, or effusion. No suspicious pulmonary nodules or masses. Upper Abdomen: No acute abnormalities present in the visualized portions of the upper abdomen. Diffuse hepatic hypoattenuation compatible with hepatic steatosis. Sparing along the gallbladder fossa. Musculoskeletal: No acute osseous abnormality or suspicious osseous lesions. There is a ovoid 1.8 cm mass in the lower outer quadrant of the right breast (4/65). No other worrisome or conspicuous chest wall lesions are evident. Review of the MIP images confirms the above findings. IMPRESSION: 1. Saddle pulmonary embolus extending between the left and right main stem bronchus and into the lobar and segmental branches of the right upper, middle and both lower lobes as well as into the lingular segmental branches as well. Positive for acute PE with CT evidence of right heart strain (RV/LV Ratio 1.15) consistent with at least submassive (intermediate risk) PE. The presence of right heart strain has been associated with an increased risk of morbidity and mortality. 2. Ovoid 1.8 cm mass in the lower outer quadrant of the right breast. Recommend further evaluation with mammography and ultrasound if not previously performed. 3. Wedge-shaped soft tissue in the anterior mediastinum with some fatty stippling, favored to reflect residual thymus. 4. Hepatic steatosis. Critical Value/emergent results were called by telephone at the time of interpretation on 07/13/2020 at 1:02 am to provider PA McDonald , who verbally acknowledged these results. Electronically Signed   By: Kreg ShropshirePrice  DeHay M.D.   On: 07/13/2020 01:03    Procedures .Critical Care Performed by: Devoria AlbeKnapp, Garnell Phenix, MD Authorized by: Devoria AlbeKnapp, Christopher Glasscock, MD   Critical care provider statement:    Critical care time (minutes):  39   Critical care was necessary to treat or  prevent imminent or life-threatening deterioration of the following conditions:  Respiratory failure and circulatory failure   Critical care was time spent personally by me on the following activities:  Discussions with consultants, examination of patient, obtaining history from patient or surrogate, ordering and review of laboratory studies, ordering and review of radiographic studies, pulse oximetry, re-evaluation of patient's condition and review of old charts   (including critical care time)  Medications Ordered in ED Medications  heparin ADULT infusion 100 units/mL (25000 units/24350mL sodium chloride 0.45%) (1,200 Units/hr Intravenous New Bag/Given 07/13/20 0218)  albuterol (VENTOLIN HFA) 108 (90 Base) MCG/ACT inhaler 4 puff (4 puffs Inhalation Given 07/13/20 0029)  iohexol (OMNIPAQUE) 350 MG/ML injection 100 mL (100 mLs Intravenous Contrast Given 07/13/20 0109)  sodium chloride (PF) 0.9 % injection (  Given by Other 07/13/20 0121)  heparin bolus via infusion 4,000 Units (4,000 Units Intravenous Bolus from Bag 07/13/20 0221)    ED Course  I have reviewed the triage vital signs and the nursing notes.  Pertinent labs & imaging results that were available during my care of the patient were reviewed by me and considered in my medical decision making (see chart for details).    MDM Rules/Calculators/A&P                          Review of her podiatry visit on November 2 show she has a closed nondisplaced fracture of the fifth metatarsal bone of the left foot and  has routine healing.  They felt that she can continue with nonoperative healing at this point.  They were going to recheck her in 3 weeks and as long as the fracture remains nondisplaced they will continue her in the boot.  Concern is patient has significant tachycardia and I do not hear any wheezing on her exam.  Patient states her chest feels tight and she felt like she had wheezing earlier.  Patient is on birth control pills and she has  been less active than normal due to her foot fracture so she is at increased risk for PE.  CTA of the of the chest was done.  Results of her CTA was called right at the time change.  Heparin consult was made to pharmacy.  Patient was informed of her diagnosis and need for admission.  1:36 AM new time Dr. Welton Flakes, hospitalist will admit  Final Clinical Impression(s) / ED Diagnoses Final diagnoses:  Acute saddle pulmonary embolism with acute cor pulmonale [facility])    Rx / DC Orders  Plan admission  Devoria Albe, MD, Concha Pyo, MD 07/13/20 [phone]

## 2020-07-14 LAB — CBC
HCT: 42.7 % (ref 36.0–46.0)
Hemoglobin: 13.9 g/dL (ref 12.0–15.0)
MCH: 26.7 pg (ref 26.0–34.0)
MCHC: 32.6 g/dL (ref 30.0–36.0)
MCV: 82 fL (ref 80.0–100.0)
Platelets: 339 10*3/uL (ref 150–400)
RBC: 5.21 MIL/uL — ABNORMAL HIGH (ref 3.87–5.11)
RDW: 14.9 % (ref 11.5–15.5)
WBC: 12 10*3/uL — ABNORMAL HIGH (ref 4.0–10.5)
nRBC: 0 % (ref 0.0–0.2)

## 2020-07-14 LAB — COMPREHENSIVE METABOLIC PANEL
ALT: 20 U/L (ref 0–44)
AST: 22 U/L (ref 15–41)
Albumin: 3.8 g/dL (ref 3.5–5.0)
Alkaline Phosphatase: 59 U/L (ref 38–126)
Anion gap: 12 (ref 5–15)
BUN: 14 mg/dL (ref 6–20)
CO2: 20 mmol/L — ABNORMAL LOW (ref 22–32)
Calcium: 9.1 mg/dL (ref 8.9–10.3)
Chloride: 107 mmol/L (ref 98–111)
Creatinine, Ser: 0.71 mg/dL (ref 0.44–1.00)
GFR, Estimated: >60 mL/min (ref >60)
Glucose, Bld: 117 mg/dL — ABNORMAL HIGH (ref 70–99)
Potassium: 3.6 mmol/L (ref 3.5–5.1)
Sodium: 139 mmol/L (ref 135–145)
Total Bilirubin: 0.5 mg/dL (ref 0.3–1.2)
Total Protein: 7.9 g/dL (ref 6.5–8.1)

## 2020-07-14 LAB — HEPARIN LEVEL (UNFRACTIONATED)
Heparin Unfractionated: 0.33 IU/mL (ref 0.30–0.70)
Heparin Unfractionated: 0.36 IU/mL (ref 0.30–0.70)
Heparin Unfractionated: 0.41 IU/mL (ref 0.30–0.70)
Heparin Unfractionated: 0.55 IU/mL (ref 0.30–0.70)

## 2020-07-14 MED ORDER — HYDROCODONE-ACETAMINOPHEN 5-325 MG PO TABS: 1.0000 | ORAL_TABLET | ORAL | Status: DC | PRN

## 2020-07-14 MED ORDER — HEPARIN (PORCINE) 25000 UT/250ML-% IV SOLN
1750.0000 [IU]/h | INTRAVENOUS | Status: DC
  Administered 2020-07-14: 1750 [IU]/h via INTRAVENOUS

## 2020-07-14 MED ORDER — IBUPROFEN 200 MG PO TABS: 400.0000 mg | ORAL_TABLET | Freq: Four times a day (QID) | ORAL | Status: DC | PRN

## 2020-07-14 MED ORDER — ALPRAZOLAM 0.5 MG PO TABS
0.5000 mg | ORAL_TABLET | Freq: Two times a day (BID) | ORAL | Status: DC | PRN
  Administered 2020-07-14 – 2020-07-15 (×2): 0.5 mg via ORAL
  Filled 2020-07-14 (×2): qty 1

## 2020-07-14 NOTE — Progress Notes (Signed)
ANTICOAGULATION CONSULT NOTE - Follow Up Consult  Pharmacy Consult for Heparin Indication: pulmonary embolus  Allergies  Allergen Reactions  . Penicillins Rash    Arms and chest. Has tolerated amoxicillin and Augmentin many times since then without difficulty.  . Sulfa Antibiotics Other (See Comments)    Behavior change.  Became agitated and angry.    Patient Measurements: Height: 5\' 2"  (157.5 cm) Weight: 101.2 kg (223 lb 1.7 oz) IBW/kg (Calculated) : 50.1  Heparin Dosing Weight: 74 kg    Medications:  Infusions:  . heparin 1,750 Units/hr (07/14/20 2101)    Assessment: 34 yo female present with SOB and wheezing. Pt is recovering from a fracture of her foot, has been nonweightbearing for the past 3 weeks.  Pharmacy consulted to dose heparin for saddle PE with right heart strain.  No prior AC noted.  Update: HL 0.41, therapeutic on heparin at 1750 units/hr No pauses or interruptions.  No bleeding or complications reported per Lani RN  Night update: Heparin level = 0.36 (therapeutic) with heparin infusing @ 1750 units/hr.  No complications of therapy noted.  Goal of Therapy:  Heparin level 0.3-0.7 units/ml Monitor platelets by anticoagulation protocol: Yes   Plan:  Continue heparin IV infusion at 1750 units/hr  Daily heparin level and CBC  Monitor for signs/symptoms of bleeding   31, PharmD 07/14/2020 10:46 PM

## 2020-07-14 NOTE — Progress Notes (Signed)
ANTICOAGULATION CONSULT NOTE - Follow Up Consult  Pharmacy Consult for Heparin Indication: pulmonary embolus  Allergies  Allergen Reactions  . Penicillins Rash    Arms and chest. Has tolerated amoxicillin and Augmentin many times since then without difficulty.  . Sulfa Antibiotics Other (See Comments)    Behavior change.  Became agitated and angry.    Patient Measurements: Height: 5\' 2"  (157.5 cm) Weight: 101.2 kg (223 lb 1.7 oz) IBW/kg (Calculated) : 50.1  Heparin Dosing Weight: 74 kg    Medications:  Infusions:  . heparin 1,750 Units/hr (07/14/20 0848)    Assessment: 34 yo female present with SOB and wheezing. Pt is recovering from a fracture of her foot, has been nonweightbearing for the past 3 weeks.  Pharmacy consulted to dose heparin for saddle PE with right heart strain.  No prior AC noted.  Update: HL 0.41, therapeutic on heparin at 1750 units/hr No pauses or interruptions.  No bleeding or complications reported per Lani RN  Goal of Therapy:  Heparin level 0.3-0.7 units/ml Monitor platelets by anticoagulation protocol: Yes   Plan:  Continue heparin IV infusion at 1750 units/hr  Recheck heparin level in 6 hours  Daily heparin level and CBC  Monitor for signs/symptoms of bleeding   31 PharmD, BCPS Clinical Pharmacist [facility] main pharmacy [phone] 07/14/2020 4:33 PM

## 2020-07-14 NOTE — Progress Notes (Signed)
ANTICOAGULATION CONSULT NOTE - Follow Up Consult  Pharmacy Consult for Heparin Indication: pulmonary embolus  Allergies  Allergen Reactions  . Penicillins Rash    Arms and chest. Has tolerated amoxicillin and Augmentin many times since then without difficulty.  . Sulfa Antibiotics Other (See Comments)    Behavior change.  Became agitated and angry.    Patient Measurements: Height: 5\' 2"  (157.5 cm) Weight: 101.2 kg (223 lb 1.7 oz) IBW/kg (Calculated) : 50.1  Height: 62 inches, Weight 90.7 kg (07/08/2020) Heparin Dosing Weight: 71 kg  Vital Signs: Temp: 97.4 F (36.3 C) (11/08 0819) Temp Source: Oral (11/08 0819) BP: 145/97 (11/08 0600) Pulse Rate: 83 (11/08 0700)  Labs: Recent Labs    07/13/20 0005 07/13/20 0005 07/13/20 0036 07/13/20 0036 07/13/20 0209 07/13/20 0822 07/13/20 0822 07/13/20 1610 07/14/20 0030 07/14/20 0714  HGB 15.0   < > 15.3*   < >  --  14.9  --   --  13.9  --   HCT 46.3*   < > 45.0  --   --  47.6*  --   --  42.7  --   PLT 353  --   --   --   --  345  --   --  339  --   APTT  --   --   --   --  30  --   --   --   --   --   LABPROT  --   --   --   --  12.8  --   --   --   --   --   INR  --   --   --   --  1.0  --   --   --   --   --   HEPARINUNFRC  --   --   --   --   --  0.15*   < > 0.15* 0.55 0.33  CREATININE  --   --  0.60  --   --  0.67  --   --  0.71  --    < > = values in this interval not displayed.    Estimated Creatinine Clearance: 108.2 mL/min (by C-G formula based on SCr of 0.71 mg/dL).   Medications:  Scheduled:  . Chlorhexidine Gluconate Cloth  6 each Topical Daily   Infusions:  . heparin 1,650 Units/hr (07/14/20 13/08/21)   PRN: acetaminophen **OR** acetaminophen, albuterol, HYDROcodone-acetaminophen, hydrOXYzine, ibuprofen, labetalol, morphine injection, ondansetron **OR** ondansetron (ZOFRAN) IV  Assessment: 34 yo female present with SOB and wheezing. Pt is recovering from a fracture of her foot, has been nonweightbearing for  the past 3 weeks.  Pharmacy consulted to dose heparin for saddle PE with right heart strain.  No prior AC noted.  Today, 07/14/2020  Heparin level SUBtherapeutic at 0700 but level dropping on current IV heparin rate of 1650 units/hr  CBC stable  No bleeding or complications with IV nor was the infusion paused  Goal of Therapy:  Heparin level 0.3-0.7 units/ml Monitor platelets by anticoagulation protocol: Yes   Plan:   Increase heparin IV infusion from 1650 units/hr to 1750 units/hr  Recheck heparin level 6hrs after rate change  Daily heparin level and CBC  Monitor for signs/symptoms of bleeding   13/04/2020, PharmD, BCPS [phone] until 3pm 07/14/2020 8:35 AM

## 2020-07-14 NOTE — Progress Notes (Signed)
ANTICOAGULATION CONSULT NOTE - Follow Up Consult  Pharmacy Consult for Heparin Indication: pulmonary embolus  Allergies  Allergen Reactions  . Penicillins Rash    Arms and chest. Has tolerated amoxicillin and Augmentin many times since then without difficulty.  . Sulfa Antibiotics Other (See Comments)    Behavior change.  Became agitated and angry.    Patient Measurements: Height: 5\' 2"  (157.5 cm) Weight: 101.2 kg (223 lb 1.7 oz) IBW/kg (Calculated) : 50.1  Height: 62 inches, Weight 90.7 kg (07/08/2020) Heparin Dosing Weight: 71 kg  Vital Signs: Temp: 97.8 F (36.6 C) (11/07 2351) Temp Source: Oral (11/07 2351) BP: 153/100 (11/07 2100) Pulse Rate: 95 (11/07 2100)  Labs: Recent Labs    07/13/20 0005 07/13/20 0005 07/13/20 0036 07/13/20 0036 07/13/20 0209 07/13/20 0822 07/13/20 1610 07/14/20 0030  HGB 15.0   < > 15.3*   < >  --  14.9  --  13.9  HCT 46.3*   < > 45.0  --   --  47.6*  --  42.7  PLT 353  --   --   --   --  345  --  339  APTT  --   --   --   --  30  --   --   --   LABPROT  --   --   --   --  12.8  --   --   --   INR  --   --   --   --  1.0  --   --   --   HEPARINUNFRC  --   --   --   --   --  0.15* 0.15* 0.55  CREATININE  --   --  0.60  --   --  0.67  --   --    < > = values in this interval not displayed.    Estimated Creatinine Clearance: 108.2 mL/min (by C-G formula based on SCr of 0.67 mg/dL).   Medications:  Scheduled:  . Chlorhexidine Gluconate Cloth  6 each Topical Daily   Infusions:  . heparin 1,650 Units/hr (07/13/20 1900)   PRN: acetaminophen **OR** acetaminophen, albuterol, HYDROcodone-acetaminophen, hydrOXYzine, ibuprofen, labetalol, morphine injection, ondansetron **OR** ondansetron (ZOFRAN) IV  Assessment: 34 yo female present with SOB and wheezing. Pt is recovering from a fracture of her foot, has been nonweightbearing for the past 3 weeks.  Pharmacy consulted to dose heparin for PE.  No prior AC noted.  Today,  07/14/2020  Heparin level 0.55 therapeutic on 1650 units/hr  CBC stable  No bleeding or complications with IV nor was the infusion paused  Goal of Therapy:  Heparin level 0.3-0.7 units/ml Monitor platelets by anticoagulation protocol: Yes   Plan:   Continue Heparin IV infusion at 1650 units/hr  Recheck heparin level in 6hrs  Daily heparin level and CBC  Monitor for signs/symptoms of bleeding  13/04/2020 RPh 07/14/2020, 12:59 AM

## 2020-07-14 NOTE — Progress Notes (Signed)
PROGRESS NOTE    Joy Boyer  VOZ:[id number] DOB: [birth year] DOA: 07/12/2020 PCP: Pcp, No    Brief Narrative:  34 y.o. female with medical history significant of asthma and recent fracture of her right foot presented to ED for evaluation of worsening shortness of breath.  Patient states that she started having sudden onset of shortness of breath and exertional dyspnea around 5 PM yesterday.  Patient used her albuterol inhaler but it did not help with shortness of breath and she decided to visit the ED for evaluation.  Patient states that she was recently had a fracture of her left foot and she is using wheelchair, walker and crutches for the last 3 weeks.  Assessment & Plan:   Principal Problem:   Acute saddle pulmonary embolism (HCC) Active Problems:   Foot fracture, left  Principal Problem:   Acute saddle pulmonary embolism (HCC) -O2 sats and vital signs stable -CT personally reviewed. Findings of saddle PE involving L and R pulm arteries with heart strain on CT -2d echo performed and reviewed. RV systolic function is normal -LE dopplers obtained and reviewed, findings of LLE DVT -Appreciate input by IR. Recommendation to continue heparin gtt for now -If condition changes or pt becomes unstable, would consult CCM at that time -Remains stable overnight, somewhat elevated BP, although pt is visibly anxious and acknowledges having "white coat syndrome"  Active Problems:   Foot fracture prior to admit -Followed by Podiatry as outpatient -Foot orthotic currently in place -Consider PT eval in 48-72hrs if pt remains stable   Asthma -Remains stable, on room air  Obesity -Recommend diet/lifestyle modification  Anxiety -visibly appears anxious -Pt admits to feeling nervious being in hospital -Continue on PRN vistaril  DVT prophylaxis: Heparin subq Code Status: Full Family Communication: Pt in room, family not at bedside  Status is: Inpatient  Remains inpatient  appropriate because:Ongoing diagnostic testing needed not appropriate for outpatient work up, IV treatments appropriate due to intensity of illness or inability to take PO and Inpatient level of care appropriate due to severity of illness   Dispo: The patient is from: Home              Anticipated d/c is to: Home              Anticipated d/c date is: 3 days              Patient currently is not medically stable to d/c.   Consultants:   IR  Procedures:     Antimicrobials: Anti-infectives (From admission, onward)   None      Subjective: Feeling somewhat anxious this AM, visibly shaking. Denies chest pain or sob  Objective: Vitals:   07/14/20 1000 07/14/20 1004 07/14/20 1122 07/14/20 1200  BP: (!) 181/109 (!) 174/109 (!) 181/94 (!) 177/107  Pulse: 94 95 82 86  Resp: 12 (!) 21 13 13   Temp:    98.4 F (36.9 C)  TempSrc:    Oral  SpO2: 94% 98% 98% 90%  Weight:      Height:        Intake/Output Summary (Last 24 hours) at 07/14/2020 1350 Last data filed at 07/14/2020 0847 Gross per 24 hour  Intake 616.64 ml  Output 500 ml  Net 116.64 ml   Filed Weights   07/13/20 0900 07/13/20 0954  Weight: 90.7 kg 101.2 kg    Examination: General exam: Conversant, in no acute distress Respiratory system: normal chest rise, clear, no audible wheezing Cardiovascular system: regular rhythm,  s1-s2 Gastrointestinal system: Nondistended, nontender, pos BS Central nervous system: No seizures, no tremors Extremities: No cyanosis, no joint deformities Skin: No rashes, no pallor Psychiatry: Appears anxious // no auditory hallucinations   Data Reviewed: I have personally reviewed following labs and imaging studies  CBC: Recent Labs  Lab 07/13/20 0005 07/13/20 0036 07/13/20 0822 07/14/20 0030  WBC 11.9*  --  11.2* 12.0*  NEUTROABS 6.7  --   --   --   HGB 15.0 15.3* 14.9 13.9  HCT 46.3* 45.0 47.6* 42.7  MCV 82.7  --  85.8 82.0  PLT 353  --  345 339   Basic Metabolic  Panel: Recent Labs  Lab 07/13/20 0036 07/13/20 0822 07/14/20 0030  NA 143 138 139  K 3.9 4.2 3.6  CL 106 107 107  CO2  --  16* 20*  GLUCOSE 130* 118* 117*  BUN 13 10 14   CREATININE 0.60 0.67 0.71  CALCIUM  --  9.4 9.1   GFR: Estimated Creatinine Clearance: 108.2 mL/min (by C-G formula based on SCr of 0.71 mg/dL). Liver Function Tests: Recent Labs  Lab 07/13/20 0822 07/14/20 0030  AST 24 22  ALT 21 20  ALKPHOS 59 59  BILITOT 0.5 0.5  PROT 8.4* 7.9  ALBUMIN 3.9 3.8   No results for input(s): LIPASE, AMYLASE in the last 168 hours. No results for input(s): AMMONIA in the last 168 hours. Coagulation Profile: Recent Labs  Lab 07/13/20 0209  INR 1.0   Cardiac Enzymes: No results for input(s): CKTOTAL, CKMB, CKMBINDEX, TROPONINI in the last 168 hours. BNP (last 3 results) No results for input(s): PROBNP in the last 8760 hours. HbA1C: No results for input(s): HGBA1C in the last 72 hours. CBG: No results for input(s): GLUCAP in the last 168 hours. Lipid Profile: No results for input(s): CHOL, HDL, LDLCALC, TRIG, CHOLHDL, LDLDIRECT in the last 72 hours. Thyroid Function Tests: No results for input(s): TSH, T4TOTAL, FREET4, T3FREE, THYROIDAB in the last 72 hours. Anemia Panel: No results for input(s): VITAMINB12, FOLATE, FERRITIN, TIBC, IRON, RETICCTPCT in the last 72 hours. Sepsis Labs: No results for input(s): PROCALCITON, LATICACIDVEN in the last 168 hours.  Recent Results (from the past 240 hour(s))  Respiratory Panel by RT PCR (Flu A&B, Covid) - Nasopharyngeal Swab     Status: None   Collection Time: 07/13/20 12:07 AM   Specimen: Nasopharyngeal Swab  Result Value Ref Range Status   SARS Coronavirus 2 by RT PCR NEGATIVE NEGATIVE Final    Comment: (NOTE) SARS-CoV-2 target nucleic acids are NOT DETECTED.  The SARS-CoV-2 RNA is generally detectable in upper respiratoy specimens during the acute phase of infection. The lowest concentration of SARS-CoV-2 viral  copies this assay can detect is 131 copies/mL. A negative result does not preclude SARS-Cov-2 infection and should not be used as the sole basis for treatment or other patient management decisions. A negative result may occur with  improper specimen collection/handling, submission of specimen other than nasopharyngeal swab, presence of viral mutation(s) within the areas targeted by this assay, and inadequate number of viral copies (<131 copies/mL). A negative result must be combined with clinical observations, patient history, and epidemiological information. The expected result is Negative.  Fact Sheet for Patients:  https://www.moore.com/  Fact Sheet for Healthcare Providers:  https://www.young.biz/  This test is no t yet approved or cleared by the [place] FDA and  has been authorized for detection and/or diagnosis of SARS-CoV-2 by FDA under an Emergency Use Authorization (EUA). This EUA will  remain  in effect (meaning this test can be used) for the duration of the COVID-19 declaration under Section 564(b)(1) of the Act, 21 U.S.C. section 360bbb-3(b)(1), unless the authorization is terminated or revoked sooner.     Influenza A by PCR NEGATIVE NEGATIVE Final   Influenza B by PCR NEGATIVE NEGATIVE Final    Comment: (NOTE) The Xpert Xpress SARS-CoV-2/FLU/RSV assay is intended as an aid in  the diagnosis of influenza from Nasopharyngeal swab specimens and  should not be used as a sole basis for treatment. Nasal washings and  aspirates are unacceptable for Xpert Xpress SARS-CoV-2/FLU/RSV  testing.  Fact Sheet for Patients: https://www.moore.com/  Fact Sheet for Healthcare Providers: https://www.young.biz/  This test is not yet approved or cleared by the [place] FDA and  has been authorized for detection and/or diagnosis of SARS-CoV-2 by  FDA under an Emergency Use Authorization (EUA). This  EUA will remain  in effect (meaning this test can be used) for the duration of the  Covid-19 declaration under Section 564(b)(1) of the Act, 21  U.S.C. section 360bbb-3(b)(1), unless the authorization is  terminated or revoked. Performed at [facility], [address], [place], [place] [address]   MRSA PCR Screening     Status: None   Collection Time: 07/13/20  5:19 AM   Specimen: Nasopharyngeal  Result Value Ref Range Status   MRSA by PCR NEGATIVE NEGATIVE Final    Comment:        The GeneXpert MRSA Assay (FDA approved for NASAL specimens only), is one component of a comprehensive MRSA colonization surveillance program. It is not intended to diagnose MRSA infection nor to guide or monitor treatment for MRSA infections. Performed at [facility], [address], [place], [place] [address]      Radiology Studies: DG Chest 2 View  Result Date: 07/13/2020 CLINICAL DATA:  Tachycardia, shortness of breath EXAM: CHEST - 2 VIEW COMPARISON:  None. FINDINGS: Low lung volumes with vascular crowding. Some hazy opacity in the right lung base adjacent the asymmetrically elevated right hemidiaphragm is favored to reflect atelectasis without other focal opacity, convincing features of edema, pneumothorax or effusion. The cardiomediastinal contours are unremarkable. Telemetry leads overlie the chest. No acute osseous or soft tissue abnormality. IMPRESSION: Low lung volumes with vascular crowding and likely right basilar atelectasis. Electronically Signed   By: Kreg Shropshire M.D.   On: 07/13/2020 00:41   CT Angio Chest PE W/Cm &/Or Wo Cm  Result Date: 07/13/2020 CLINICAL DATA:  Cough, recently broke foot with increasing exertion, PE suspected, suspected asthma attack but without relief with inhaler. EXAM: CT ANGIOGRAPHY CHEST WITH CONTRAST TECHNIQUE: Multidetector CT imaging of the chest was performed using the standard protocol during bolus administration of  intravenous contrast. Multiplanar CT image reconstructions and MIPs were obtained to evaluate the vascular anatomy. CONTRAST:  OMNIPAQUE IOHEXOL 350 MG/ML SOLN COMPARISON:  Radiograph 07/13/2020 FINDINGS: Cardiovascular: Satisfactory opacification of the pulmonary arteries with evaluation slightly limited by some some respiratory motion artifact which more pronounced towards the lung bases. There is however visible saddle pulmonary embolus extending between the left and right main stem bronchus and into the lobar and segmental branches of the right upper, middle and both lower lobes as well as into the lingular segmental branches as well. Flattening of the intraventricular septum with elevation of the RV/LV ratio to 1.15. Slight reflux of contrast into the IVC as well. Cardiac size is otherwise within normal limits. The aortic root is suboptimally  assessed given cardiac pulsation artifact. The aorta is normal caliber. No acute luminal abnormality of the imaged aorta. No periaortic stranding or hemorrhage. Mediastinum/Nodes: Wedge-shaped soft tissue seen on in the anterior mediastinum with some fatty stippling. No mediastinal fluid or gas. Normal thyroid gland and thoracic inlet. No acute abnormality of the trache the ankle thanks in native distal DA given the colon anise nasally a or esophagus. No worrisome mediastinal, hilar or axillary adenopathy. Lungs/Pleura: Evaluation lung parenchyma limited by extensive respiratory motion artifact. No consolidation, features of edema, pneumothorax, or effusion. No suspicious pulmonary nodules or masses. Upper Abdomen: No acute abnormalities present in the visualized portions of the upper abdomen. Diffuse hepatic hypoattenuation compatible with hepatic steatosis. Sparing along the gallbladder fossa. Musculoskeletal: No acute osseous abnormality or suspicious osseous lesions. There is a ovoid 1.8 cm mass in the lower outer quadrant of the right breast (4/65). No other  worrisome or conspicuous chest wall lesions are evident. Review of the MIP images confirms the above findings. IMPRESSION: 1. Saddle pulmonary embolus extending between the left and right main stem bronchus and into the lobar and segmental branches of the right upper, middle and both lower lobes as well as into the lingular segmental branches as well. Positive for acute PE with CT evidence of right heart strain (RV/LV Ratio 1.15) consistent with at least submassive (intermediate risk) PE. The presence of right heart strain has been associated with an increased risk of morbidity and mortality. 2. Ovoid 1.8 cm mass in the lower outer quadrant of the right breast. Recommend further evaluation with mammography and ultrasound if not previously performed. 3. Wedge-shaped soft tissue in the anterior mediastinum with some fatty stippling, favored to reflect residual thymus. 4. Hepatic steatosis. Critical Value/emergent results were called by telephone at the time of interpretation on 07/13/2020 at 1:02 am to provider PA McDonald , who verbally acknowledged these results. Electronically Signed   By: Kreg Shropshire M.D.   On: 07/13/2020 01:03   ECHOCARDIOGRAM COMPLETE  Result Date: 07/13/2020    ECHOCARDIOGRAM REPORT   Patient Name:   Joy Boyer Date of Exam: 07/13/2020 Medical Rec #:  [id number]    Height:       62.0 in Accession #:    [id number]   Weight:       223.1 lb Date of Birth:  07-22-1986    BSA:          2.003 m Patient Age:    34 years     BP:           167/121 mmHg Patient Gender: F            HR:           106 bpm. Exam Location:  Inpatient Procedure: 2D Echo and Intracardiac Opacification Agent Indications:    Pulmonary Embolus 415.19 / I26.99  History:        Patient has no prior history of Echocardiogram examinations.  Sonographer:    Leta Jungling RDCS Referring Phys: [id number] [facility] KHAN IMPRESSIONS  1. Left ventricular ejection fraction, by estimation, is 60 to 65%. The left ventricle has normal  function. The left ventricle has no regional wall motion abnormalities. Left ventricular diastolic parameters are consistent with Grade I diastolic dysfunction (impaired relaxation).  2. Right ventricular systolic function is normal. The right ventricular size is normal. There is normal pulmonary artery systolic pressure.  3. The mitral valve was not well visualized. No evidence of mitral valve regurgitation. No evidence of mitral  stenosis.  4. The aortic valve has an indeterminant number of cusps. Aortic valve regurgitation is not visualized. No aortic stenosis is present. FINDINGS  Left Ventricle: Left ventricular ejection fraction, by estimation, is 60 to 65%. The left ventricle has normal function. The left ventricle has no regional wall motion abnormalities. Definity contrast agent was given IV to delineate the left ventricular  endocardial borders. The left ventricular internal cavity size was normal in size. There is no left ventricular hypertrophy. Left ventricular diastolic parameters are consistent with Grade I diastolic dysfunction (impaired relaxation). Normal left ventricular filling pressure. Right Ventricle: The right ventricular size is normal. No increase in right ventricular wall thickness. Right ventricular systolic function is normal. There is normal pulmonary artery systolic pressure. The tricuspid regurgitant velocity is 2.20 m/s, and  with an assumed right atrial pressure of 3 mmHg, the estimated right ventricular systolic pressure is 22.4 mmHg. Left Atrium: Left atrial size was normal in size. Right Atrium: Right atrial size was normal in size. Pericardium: There is no evidence of pericardial effusion. Mitral Valve: The mitral valve was not well visualized. No evidence of mitral valve regurgitation. No evidence of mitral valve stenosis. Tricuspid Valve: The tricuspid valve is normal in structure. Tricuspid valve regurgitation is mild . No evidence of tricuspid stenosis. Aortic Valve: The  aortic valve has an indeterminant number of cusps. Aortic valve regurgitation is not visualized. No aortic stenosis is present. Aortic valve mean gradient measures 3.6 mmHg. Aortic valve peak gradient measures 6.6 mmHg. Aortic valve area, by VTI measures 2.45 cm. Pulmonic Valve: The pulmonic valve was not well visualized. Pulmonic valve regurgitation is not visualized. No evidence of pulmonic stenosis. Aorta: The aortic root is normal in size and structure. Pulmonary Artery: Indeterminant PASP, IVC poorly visualized. Venous: The inferior vena cava was not well visualized. IAS/Shunts: The interatrial septum was not well visualized.  LEFT VENTRICLE PLAX 2D LVIDd:         3.71 cm     Diastology LVIDs:         2.45 cm     LV e' medial:    10.40 cm/s LV PW:         1.05 cm     LV E/e' medial:  5.7 LV IVS:        0.94 cm     LV e' lateral:   10.30 cm/s LVOT diam:     1.90 cm     LV E/e' lateral: 5.8 LV SV:         46 LV SV Index:   23 LVOT Area:     2.84 cm  LV Volumes (MOD) LV vol d, MOD A2C: 79.7 ml LV vol d, MOD A4C: 76.6 ml LV vol s, MOD A2C: 30.1 ml LV vol s, MOD A4C: 28.4 ml LV SV MOD A2C:     49.6 ml LV SV MOD A4C:     76.6 ml LV SV MOD BP:      49.7 ml LEFT ATRIUM             Index LA diam:        2.20 cm 1.10 cm/m LA Vol (A2C):   16.6 ml 8.29 ml/m LA Vol (A4C):   22.9 ml 11.44 ml/m LA Biplane Vol: 19.7 ml 9.84 ml/m  AORTIC VALVE AV Area (Vmax):    2.75 cm AV Area (Vmean):   2.34 cm AV Area (VTI):     2.45 cm AV Vmax:  127.99 cm/s AV Vmean:          90.095 cm/s AV VTI:            0.187 m AV Peak Grad:      6.6 mmHg AV Mean Grad:      3.6 mmHg LVOT Vmax:         124.24 cm/s LVOT Vmean:        74.361 cm/s LVOT VTI:          0.161 m LVOT/AV VTI ratio: 0.86  AORTA Ao Root diam: 2.80 cm MITRAL VALVE               TRICUSPID VALVE MV Area (PHT): 8.92 cm    TR Peak grad:   19.4 mmHg MV Decel Time: 85 msec     TR Vmax:        220.00 cm/s MV E velocity: 59.70 cm/s MV A velocity: 77.10 cm/s  SHUNTS MV E/A  ratio:  0.77        Systemic VTI:  0.16 m                            Systemic Diam: 1.90 cm Dina Rich MD Electronically signed by Dina Rich MD Signature Date/Time: 07/13/2020/12:32:26 PM    Final    VAS [place] LOWER EXTREMITY VENOUS (DVT)  Result Date: 07/13/2020  Lower Venous DVT Study Indications: Saddle pulmonary embolism. Left fifth metatarsal fracture x3 weeks with left calf cramping approximately 3 weeks ago. Non-weight bearing precautions on LLE.  Comparison Study: No prior study Performing Technologist: Gertie Fey MHA, RDMS, RVT, RDCS  Examination Guidelines: A complete evaluation includes B-mode imaging, spectral Doppler, color Doppler, and power Doppler as needed of all accessible portions of each vessel. Bilateral testing is considered an integral part of a complete examination. Limited examinations for reoccurring indications may be performed as noted. The reflux portion of the exam is performed with the patient in reverse Trendelenburg.  +---------+---------------+---------+-----------+----------+--------------+ RIGHT    CompressibilityPhasicitySpontaneityPropertiesThrombus Aging +---------+---------------+---------+-----------+----------+--------------+ CFV      Full           No       Yes                                 +---------+---------------+---------+-----------+----------+--------------+ SFJ      Full                                                        +---------+---------------+---------+-----------+----------+--------------+ FV Prox  Full                                                        +---------+---------------+---------+-----------+----------+--------------+ FV Mid   Full                                                        +---------+---------------+---------+-----------+----------+--------------+ FV DistalFull                                                         +---------+---------------+---------+-----------+----------+--------------+  PFV      Full                                                        +---------+---------------+---------+-----------+----------+--------------+ POP      Full           No       Yes                                 +---------+---------------+---------+-----------+----------+--------------+ PTV      Full                                                        +---------+---------------+---------+-----------+----------+--------------+ PERO     Full                                                        +---------+---------------+---------+-----------+----------+--------------+   +---------+---------------+---------+-----------+----------+--------------+ LEFT     CompressibilityPhasicitySpontaneityPropertiesThrombus Aging +---------+---------------+---------+-----------+----------+--------------+ CFV      Full           No       Yes                                 +---------+---------------+---------+-----------+----------+--------------+ SFJ      Full                                                        +---------+---------------+---------+-----------+----------+--------------+ FV Prox  Full                                                        +---------+---------------+---------+-----------+----------+--------------+ FV Mid   Full                                                        +---------+---------------+---------+-----------+----------+--------------+ FV DistalFull                                                        +---------+---------------+---------+-----------+----------+--------------+ PFV      Full                                                        +---------+---------------+---------+-----------+----------+--------------+  POP      Full           No       Yes                                  +---------+---------------+---------+-----------+----------+--------------+ PTV      None                    No                   Acute          +---------+---------------+---------+-----------+----------+--------------+ PERO     None                    No                   Acute          +---------+---------------+---------+-----------+----------+--------------+     Summary: RIGHT: - There is no evidence of deep vein thrombosis in the lower extremity.  - No cystic structure found in the popliteal fossa.  LEFT: - Findings consistent with acute deep vein thrombosis involving the left posterior tibial veins, and left peroneal veins. - No cystic structure found in the popliteal fossa.  Bilateral lower extremity venous flow is pulsatile, suggestive of possibly elevated right heart pressure.  *See table(s) above for measurements and observations. Electronically signed by Lemar LivingsBrandon Cain MD on 07/13/2020 at 8:37:42 PM.    Final     Scheduled Meds: . Chlorhexidine Gluconate Cloth  6 each Topical Daily   Continuous Infusions: . heparin 1,750 Units/hr (07/14/20 0848)     LOS: 1 day   Rickey BarbaraStephen Braxtyn Bojarski, MD [facility] Pager On Amion  If 7PM-7AM, please contact night-coverage 07/14/2020, 1:50 PM

## 2020-07-15 DIAGNOSIS — N63 Unspecified lump in unspecified breast: Secondary | ICD-10-CM

## 2020-07-15 LAB — CBC
HCT: 43.2 % (ref 36.0–46.0)
Hemoglobin: 13.8 g/dL (ref 12.0–15.0)
MCH: 26.7 pg (ref 26.0–34.0)
MCHC: 31.9 g/dL (ref 30.0–36.0)
MCV: 83.7 fL (ref 80.0–100.0)
Platelets: 298 10*3/uL (ref 150–400)
RBC: 5.16 MIL/uL — ABNORMAL HIGH (ref 3.87–5.11)
RDW: 15.1 % (ref 11.5–15.5)
WBC: 11.1 10*3/uL — ABNORMAL HIGH (ref 4.0–10.5)
nRBC: 0 % (ref 0.0–0.2)

## 2020-07-15 LAB — HEPARIN LEVEL (UNFRACTIONATED): Heparin Unfractionated: 0.39 IU/mL (ref 0.30–0.70)

## 2020-07-15 MED ORDER — APIXABAN 5 MG PO TABS
10.0000 mg | ORAL_TABLET | Freq: Two times a day (BID) | ORAL | Status: DC
  Administered 2020-07-15 – 2020-07-16 (×3): 10 mg via ORAL
  Filled 2020-07-15 (×3): qty 2

## 2020-07-15 MED ORDER — ALPRAZOLAM 0.5 MG PO TABS: 0.5000 mg | ORAL_TABLET | Freq: Two times a day (BID) | ORAL | Status: DC | PRN

## 2020-07-15 MED ORDER — HYDRALAZINE HCL 20 MG/ML IJ SOLN: 5.0000 mg | INTRAMUSCULAR | Status: DC | PRN

## 2020-07-15 MED ORDER — HYDROXYZINE HCL 10 MG PO TABS
10.0000 mg | ORAL_TABLET | Freq: Three times a day (TID) | ORAL | Status: DC | PRN
  Filled 2020-07-15: qty 1

## 2020-07-15 MED ORDER — APIXABAN 5 MG PO TABS: 5.0000 mg | ORAL_TABLET | Freq: Two times a day (BID) | ORAL | Status: DC

## 2020-07-15 NOTE — TOC Initial Note (Signed)
Transition of Care [facility]) - Initial/Assessment Note    Patient Details  Name: Joy Boyer MRN: [id number] Date of Birth: Jan 18, 1986  Transition of Care [facility]) CM/SW Contact:    Golda Acre, RN Phone Number: 07/15/2020, 8:03 AM  Clinical Narrative:                 34 y.o.femalewith medical history significant ofasthma and recent fracture of her right foot presented to ED for evaluation of worsening shortness of breath. Patient states that she started having sudden onset of shortness of breath and exertional dyspnea around 5 PM yesterday. Patient used her albuterol inhaler but it did not help with shortness of breath and she decided to visit the ED for evaluation. Patient states that she was recently had a fracture of her left foot and she is using wheelchair, walker and crutches for the last 3 weeks.  Assessment & Plan:   Principal Problem:   Acute saddle pulmonary embolism (HCC) Active Problems:   Foot fracture, left Iv heparin, heparin unfractionated-0.39 Plan is to return to home Following for progression Expected Discharge Plan: Home/Self Care Barriers to Discharge: Barriers Unresolved (comment) (iv heparin and saddle P.E.)   Patient Goals and CMS Choice Patient states their goals for this hospitalization and ongoing recovery are:: to go home CMS Medicare.gov Compare Post Acute Care list provided to:: Patient Choice offered to / list presented to : Patient  Expected Discharge Plan and Services Expected Discharge Plan: Home/Self Care   Discharge Planning Services: CM Consult   Living arrangements for the past 2 months: Single Family Home                                      Prior Living Arrangements/Services Living arrangements for the past 2 months: Single Family Home Lives with:: Self Patient language and need for interpreter reviewed:: Yes Do you feel safe going back to the place where you live?: Yes      Need for Family Participation in Patient  Care: Yes (Comment) Care giver support system in place?: Yes (comment)   Criminal Activity/Legal Involvement Pertinent to Current Situation/Hospitalization: No - Comment as needed  Activities of Daily Living Home Assistive Devices/Equipment: Eyeglasses, Environmental consultant (specify type) (standard walker-no wheels) ADL Screening (condition at time of admission) Patient's cognitive ability adequate to safely complete daily activities?: Yes Is the patient deaf or have difficulty hearing?: No Does the patient have difficulty seeing, even when wearing glasses/contacts?: No Does the patient have difficulty concentrating, remembering, or making decisions?: No Patient able to express need for assistance with ADLs?: Yes Does the patient have difficulty dressing or bathing?: No Independently performs ADLs?: No Communication: Independent Dressing (OT): Independent Grooming: Independent Feeding: Independent Bathing: Independent Toileting: Independent with device (comment) In/Out Bed: Independent Walks in Home: Independent with device (comment) Does the patient have difficulty walking or climbing stairs?: Yes Weakness of Legs: Left (recently broke left foot) Weakness of Arms/Hands: None  Permission Sought/Granted                  Emotional Assessment Appearance:: Appears stated age Attitude/Demeanor/Rapport: Engaged Affect (typically observed): Calm Orientation: : Oriented to Self, Oriented to Place, Oriented to  Time, Oriented to Situation Alcohol / Substance Use: Not Applicable Psych Involvement: No (comment)  Admission diagnosis:  Acute saddle pulmonary embolism (HCC) [I26.92] Acute saddle pulmonary embolism with acute cor pulmonale (HCC) [I26.02] Patient Active Problem List  Diagnosis Date Noted  . Acute saddle pulmonary embolism (HCC) 07/13/2020  . Foot fracture, left 07/13/2020  . 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

## 2020-07-15 NOTE — Progress Notes (Signed)
ANTICOAGULATION CONSULT NOTE - Follow Up Consult  Pharmacy Consult for Heparin Indication: pulmonary embolus  Allergies  Allergen Reactions  . Penicillins Rash    Arms and chest. Has tolerated amoxicillin and Augmentin many times since then without difficulty.  . Sulfa Antibiotics Other (See Comments)    Behavior change.  Became agitated and angry.    Patient Measurements: Height: 5\' 2"  (157.5 cm) Weight: 101.2 kg (223 lb 1.7 oz) IBW/kg (Calculated) : 50.1  Heparin Dosing Weight: 74 kg    Medications:  Infusions:  . heparin 1,750 Units/hr (07/15/20 0548)    Assessment: 34 yo female present with SOB and wheezing. Pt is recovering from a fracture of her foot, has been nonweightbearing for the past 3 weeks.  Pharmacy consulted to dose heparin for saddle PE with right heart strain.  No prior AC noted.   Heparin level continues to be therapeutic on current IV heparin rate of 1750 units/hr  CBC stable  No reported bleeding or issues noted  Goal of Therapy:  Heparin level 0.3-0.7 units/ml Monitor platelets by anticoagulation protocol: Yes   Plan:  Continue heparin IV infusion at 1750 units/hr  Daily heparin level and CBC  Monitor for signs/symptoms of bleeding   31, PharmD, BCPS [phone] until 3pm 07/15/2020 8:26 AM

## 2020-07-15 NOTE — Discharge Instructions (Signed)
Information on my medicine - ELIQUIS (apixaban)  This medication education was reviewed with me or my healthcare representative as part of my discharge preparation.    Why was Eliquis prescribed for you? Eliquis was prescribed to treat blood clots that may have been found in the veins of your legs (deep vein thrombosis) or in your lungs (pulmonary embolism) and to reduce the risk of them occurring again.  What do You need to know about Eliquis ? The starting dose is 10 mg (two 5 mg tablets) taken TWICE daily for the FIRST SEVEN (7) DAYS, then on 07/22/2020  the dose is reduced to ONE 5 mg tablet taken TWICE daily.  Eliquis may be taken with or without food.   Try to take the dose about the same time in the morning and in the evening. If you have difficulty swallowing the tablet whole please discuss with your pharmacist how to take the medication safely.  Take Eliquis exactly as prescribed and DO NOT stop taking Eliquis without talking to the doctor who prescribed the medication.  Stopping may increase your risk of developing a new blood clot.  Refill your prescription before you run out.  After discharge, you should have regular check-up appointments with your healthcare provider that is prescribing your Eliquis.    What do you do if you miss a dose? If a dose of ELIQUIS is not taken at the scheduled time, take it as soon as possible on the same day and twice-daily administration should be resumed. The dose should not be doubled to make up for a missed dose.  Important Safety Information A possible side effect of Eliquis is bleeding. You should call your healthcare provider right away if you experience any of the following: ? Bleeding from an injury or your nose that does not stop. ? Unusual colored urine (red or dark brown) or unusual colored stools (red or black). ? Unusual bruising for unknown reasons. ? A serious fall or if you hit your head (even if there is no  bleeding).  Some medicines may interact with Eliquis and might increase your risk of bleeding or clotting while on Eliquis. To help avoid this, consult your healthcare provider or pharmacist prior to using any new prescription or non-prescription medications, including herbals, vitamins, non-steroidal anti-inflammatory drugs (NSAIDs) and supplements.  This website has more information on Eliquis (apixaban): http://www.eliquis.com/eliquis/home

## 2020-07-15 NOTE — Progress Notes (Signed)
PROGRESS NOTE    Joy Boyer  FTD:[id number] DOB: [birth year] DOA: 07/12/2020 PCP: Pcp, No    Brief Narrative:  34 y.o. female with medical history significant of asthma and recent fracture of her right foot presented to ED for evaluation of worsening shortness of breath.  Patient states that she started having sudden onset of shortness of breath and exertional dyspnea around 5 PM yesterday.  Patient used her albuterol inhaler but it did not help with shortness of breath and she decided to visit the ED for evaluation.  Patient states that she was recently had a fracture of her left foot and she is using wheelchair, walker and crutches for the last 3 weeks.  Assessment & Plan:   Principal Problem:   Acute saddle pulmonary embolism (HCC) Active Problems:   Foot fracture, left  Principal Problem:   Acute saddle pulmonary embolism (HCC) -O2 sats and vital signs stable -CT personally reviewed. Findings of saddle PE involving L and R pulm arteries with heart strain on CT -2d echo performed and reviewed. RV systolic function is normal -LE dopplers obtained and reviewed, findings of LLE DVT -Appreciate input by IR. Recommendation had been to continue heparin gtt while in ICU -After 48hrs, pt has remained medically stable in ICU. Did discuss with PCCM today. As pt has remained medically stable, Ok to begin transition to NOAC today. -Will transition to eliquis -given saddle PE, will monitor overnight to ensure stability and if stable in AM, consider possible d/c at that time  Active Problems:   Foot fracture prior to admit -Followed by Podiatry as outpatient -Foot orthotic currently in place -Pt has remained medically stable x 48hrs. Have now placed request for PT eval  Asthma -Remains stable, on room air  Obesity -Recommend diet/lifestyle modification  Anxiety -visibly appears anxious -Pt admits to feeling nervious being in hospital -Continue on PRN vistaril and xanax  Elevated  bp -suspect secondary to anxiety -continue with anxiolytic per above -Will continue on PRN hydralazine  Incidental R breast mass -f/u mammogram was ordered, still pending study -If study cannot be obtained here, recommend study to be done as outpatient  DVT prophylaxis: Heparin subq Code Status: Full Family Communication: Pt in room, family not at bedside  Status is: Inpatient  Remains inpatient appropriate because:Inpatient level of care appropriate due to severity of illness   Dispo: The patient is from: Home              Anticipated d/c is to: Home              Anticipated d/c date is: 1 day              Patient currently is not medically stable to d/c.   Consultants:   IR  Discussed case with PCCM  Procedures:     Antimicrobials: Anti-infectives (From admission, onward)   None      Subjective: Pt reports feeling anxious about incidental breast lesion on initial CT chest, otherwise denies chest pain or sob  Objective: Vitals:   07/15/20 1100 07/15/20 1200 07/15/20 1214 07/15/20 1307  BP:  (!) 192/110  (!) 151/91  Pulse: 84 81  87  Resp: 19 (!) 26  17  Temp:   98.4 F (36.9 C)   TempSrc:   Oral   SpO2: 98% 96%  98%  Weight:      Height:        Intake/Output Summary (Last 24 hours) at 07/15/2020 1356 Last data filed at 07/15/2020  7253 Gross per 24 hour  Intake 1255.22 ml  Output --  Net 1255.22 ml   Filed Weights   07/13/20 0900 07/13/20 0954  Weight: 90.7 kg 101.2 kg    Examination: General exam: Awake, laying in bed, in nad Respiratory system: Normal respiratory effort, no wheezing Cardiovascular system: regular rate, s1, s2 Gastrointestinal system: Soft, nondistended, positive BS Central nervous system: CN2-12 grossly intact, strength intact Extremities: Perfused, no clubbing, L foot with orthotic in place Skin: Normal skin turgor, no notable skin lesions seen Psychiatry: Mood normal // no visual hallucinations   Data Reviewed: I have  personally reviewed following labs and imaging studies  CBC: Recent Labs  Lab 07/13/20 0005 07/13/20 0036 07/13/20 0822 07/14/20 0030 07/15/20 0614  WBC 11.9*  --  11.2* 12.0* 11.1*  NEUTROABS 6.7  --   --   --   --   HGB 15.0 15.3* 14.9 13.9 13.8  HCT 46.3* 45.0 47.6* 42.7 43.2  MCV 82.7  --  85.8 82.0 83.7  PLT 353  --  345 339 298   Basic Metabolic Panel: Recent Labs  Lab 07/13/20 0036 07/13/20 0822 07/14/20 0030  NA 143 138 139  K 3.9 4.2 3.6  CL 106 107 107  CO2  --  16* 20*  GLUCOSE 130* 118* 117*  BUN 13 10 14   CREATININE 0.60 0.67 0.71  CALCIUM  --  9.4 9.1   GFR: Estimated Creatinine Clearance: 108.2 mL/min (by C-G formula based on SCr of 0.71 mg/dL). Liver Function Tests: Recent Labs  Lab 07/13/20 0822 07/14/20 0030  AST 24 22  ALT 21 20  ALKPHOS 59 59  BILITOT 0.5 0.5  PROT 8.4* 7.9  ALBUMIN 3.9 3.8   No results for input(s): LIPASE, AMYLASE in the last 168 hours. No results for input(s): AMMONIA in the last 168 hours. Coagulation Profile: Recent Labs  Lab 07/13/20 0209  INR 1.0   Cardiac Enzymes: No results for input(s): CKTOTAL, CKMB, CKMBINDEX, TROPONINI in the last 168 hours. BNP (last 3 results) No results for input(s): PROBNP in the last 8760 hours. HbA1C: No results for input(s): HGBA1C in the last 72 hours. CBG: No results for input(s): GLUCAP in the last 168 hours. Lipid Profile: No results for input(s): CHOL, HDL, LDLCALC, TRIG, CHOLHDL, LDLDIRECT in the last 72 hours. Thyroid Function Tests: No results for input(s): TSH, T4TOTAL, FREET4, T3FREE, THYROIDAB in the last 72 hours. Anemia Panel: No results for input(s): VITAMINB12, FOLATE, FERRITIN, TIBC, IRON, RETICCTPCT in the last 72 hours. Sepsis Labs: No results for input(s): PROCALCITON, LATICACIDVEN in the last 168 hours.  Recent Results (from the past 240 hour(s))  Respiratory Panel by RT PCR (Flu A&B, Covid) - Nasopharyngeal Swab     Status: None   Collection Time:  07/13/20 12:07 AM   Specimen: Nasopharyngeal Swab  Result Value Ref Range Status   SARS Coronavirus 2 by RT PCR NEGATIVE NEGATIVE Final    Comment: (NOTE) SARS-CoV-2 target nucleic acids are NOT DETECTED.  The SARS-CoV-2 RNA is generally detectable in upper respiratoy specimens during the acute phase of infection. The lowest concentration of SARS-CoV-2 viral copies this assay can detect is 131 copies/mL. A negative result does not preclude SARS-Cov-2 infection and should not be used as the sole basis for treatment or other patient management decisions. A negative result may occur with  improper specimen collection/handling, submission of specimen other than nasopharyngeal swab, presence of viral mutation(s) within the areas targeted by this assay, and inadequate number of  viral copies (<131 copies/mL). A negative result must be combined with clinical observations, patient history, and epidemiological information. The expected result is Negative.  Fact Sheet for Patients:  https://www.moore.com/  Fact Sheet for Healthcare Providers:  https://www.young.biz/  This test is no t yet approved or cleared by the [place] FDA and  has been authorized for detection and/or diagnosis of SARS-CoV-2 by FDA under an Emergency Use Authorization (EUA). This EUA will remain  in effect (meaning this test can be used) for the duration of the COVID-19 declaration under Section 564(b)(1) of the Act, 21 U.S.C. section 360bbb-3(b)(1), unless the authorization is terminated or revoked sooner.     Influenza A by PCR NEGATIVE NEGATIVE Final   Influenza B by PCR NEGATIVE NEGATIVE Final    Comment: (NOTE) The Xpert Xpress SARS-CoV-2/FLU/RSV assay is intended as an aid in  the diagnosis of influenza from Nasopharyngeal swab specimens and  should not be used as a sole basis for treatment. Nasal washings and  aspirates are unacceptable for Xpert Xpress  SARS-CoV-2/FLU/RSV  testing.  Fact Sheet for Patients: https://www.moore.com/  Fact Sheet for Healthcare Providers: https://www.young.biz/  This test is not yet approved or cleared by the [place] FDA and  has been authorized for detection and/or diagnosis of SARS-CoV-2 by  FDA under an Emergency Use Authorization (EUA). This EUA will remain  in effect (meaning this test can be used) for the duration of the  Covid-19 declaration under Section 564(b)(1) of the Act, 21  U.S.C. section 360bbb-3(b)(1), unless the authorization is  terminated or revoked. Performed at [facility], [address], [place], [place] [address]   MRSA PCR Screening     Status: None   Collection Time: 07/13/20  5:19 AM   Specimen: Nasopharyngeal  Result Value Ref Range Status   MRSA by PCR NEGATIVE NEGATIVE Final    Comment:        The GeneXpert MRSA Assay (FDA approved for NASAL specimens only), is one component of a comprehensive MRSA colonization surveillance program. It is not intended to diagnose MRSA infection nor to guide or monitor treatment for MRSA infections. Performed at [facility], [address] [place], [place] [address]      Radiology Studies: No results found.  Scheduled Meds: . apixaban  10 mg Oral BID   Followed by  . [START ON 07/22/2020] apixaban  5 mg Oral BID  . Chlorhexidine Gluconate Cloth  6 each Topical Daily   Continuous Infusions:    LOS: 2 days   Rickey Barbara, MD [facility] Pager On Amion  If 7PM-7AM, please contact night-coverage 07/15/2020, 1:56 PM

## 2020-07-16 ENCOUNTER — Other Ambulatory Visit (HOSPITAL_COMMUNITY): Payer: Self-pay | Admitting: Internal Medicine

## 2020-07-16 DIAGNOSIS — I2692 Saddle embolus of pulmonary artery without acute cor pulmonale: Secondary | ICD-10-CM

## 2020-07-16 LAB — CBC
HCT: 41.7 % (ref 36.0–46.0)
Hemoglobin: 13.3 g/dL (ref 12.0–15.0)
MCH: 26.9 pg (ref 26.0–34.0)
MCHC: 31.9 g/dL (ref 30.0–36.0)
MCV: 84.4 fL (ref 80.0–100.0)
Platelets: 331 10*3/uL (ref 150–400)
RBC: 4.94 MIL/uL (ref 3.87–5.11)
RDW: 15.2 % (ref 11.5–15.5)
WBC: 11.6 10*3/uL — ABNORMAL HIGH (ref 4.0–10.5)
nRBC: 0 % (ref 0.0–0.2)

## 2020-07-16 MED ORDER — APIXABAN (ELIQUIS) VTE STARTER PACK (10MG AND 5MG): ORAL_TABLET | ORAL | 0 refills | Status: DC

## 2020-07-16 MED ORDER — ALBUTEROL SULFATE HFA 108 (90 BASE) MCG/ACT IN AERS: 2.0000 | INHALATION_SPRAY | Freq: Four times a day (QID) | RESPIRATORY_TRACT | 0 refills | Status: AC | PRN

## 2020-07-16 MED ORDER — ALPRAZOLAM 0.5 MG PO TABS: 0.5000 mg | ORAL_TABLET | Freq: Two times a day (BID) | ORAL | Status: DC | PRN

## 2020-07-16 MED FILL — ELIQUIS STARTER PACK 5 MG T: 5 | 30 days supply | Qty: 74 | Fill #0

## 2020-07-16 NOTE — Discharge Summary (Signed)
Discharge Summary  Joy Boyer YNW:[id number] DOB: 1986-07-10  PCP: Pcp, No  Admit date: 07/12/2020 Discharge date: 07/16/2020  Time spent: 35 minutes   Recommendations for Outpatient Follow-up:  1. Follow-up with your PCP in 1 to 2 weeks 2. Follow-up with your gynecology in 1 week 3. Take your medications as prescribed.  Discharge Diagnoses:  Active Hospital Problems   Diagnosis Date Noted  . Acute saddle pulmonary embolism (HCC) 07/13/2020  . Foot fracture, left 07/13/2020    Resolved Hospital Problems  No resolved problems to display.    Discharge Condition: Stable  Diet recommendation: Resume previous diet.  Vitals:   07/15/20 2158 07/16/20 0621  BP: 135/82 (!) 155/87  Pulse: 96 80  Resp: 20 20  Temp: 97.8 F (36.6 C) 98.1 F (36.7 C)  SpO2: 99% 98%    History of present illness:  34 y.o.femalewith medical history significant ofasthma and recent fracture of her left foot presented to [facility] ED for evaluation of worsening shortness of breath. Patient states that she started having sudden onset of shortness of breath and exertional dyspnea around 5 PM the day prior to her presentation. Patient used her albuterol inhaler but it did not help then she decided to come to the ED for further evaluation. She recently had a fracture of her left foot and she is using a wheelchair, walker and crutches for the past 3 weeks. Work-up revealed acute saddle pulmonary embolism and left lower extremity DVT. Received 2 days of heparin drip then was started on Eliquis on 07/15/2020. Hemoglobin stable. No overt bleeding.  Intermittent elevated BP, patient states her blood pressure gets elevated when she is in healthcare settings.  Advised to follow up with her PCP.  07/16/20: Seen and examined. Denies any hemoptysis or chest pain at the time of this visit. Reports mild dyspnea with ambulation. She has no other complaints and is eager to go home.  Advised to follow-up with gynecology  due to incidentally found right breast mass. Patient understands and agrees to plan. States her gynecologist is at [facility] Course:  Principal Problem:   Acute saddle pulmonary embolism (HCC) Active Problems:   Foot fracture, left  Acute saddle pulmonary embolism (HCC) -O2 sats and vital signs stable -CT personally reviewed. Findings of saddle PE involving L and R pulm arteries with heart strain on CT -2d echo performed and reviewed. RV systolic function is normal -LE dopplers obtained and reviewed, findings of LLE DVT -Appreciate input by IR. Recommendation had been to continue heparin gtt x 48 hours, completed. -Transitioned to eliquis on 07/15/20 -Advised to follow up with her PCP and to take her medications as prescribed.  Left foot fracture prior to admit -Followed by Podiatry as outpatient -Foot orthotic currently in place  Asthma -Remains stable, on room air  Obesity  -Recommend diet/lifestyle modification  Situational Anxiety, resolved  Elevated BP Possibly 2/2 to health care setting Patient states her blood pressure gets elevated when she is in healthcare settings. Follow up with PCP  Incidental R breast mass -f/u mammogram with gyn at [facility]. -States her gynecologist is at [facility].  Code Status: Full    Consultants:   IR  Discussed case with PCCM   Discharge Exam: BP (!) 155/87 (BP Location: Left Arm)   Pulse 80   Temp 98.1 F (36.7 C) (Oral)   Resp 20   Ht 5\' 2"  (1.575 m)   Wt 101.2 kg   SpO2 98%   BMI 40.81 kg/m  .  General: 34 y.o. year-old female well developed well nourished in no acute distress.  Alert and oriented x3. . Cardiovascular: Regular rate and rhythm with no rubs or gallops.  No thyromegaly or JVD noted.   Marland Kitchen Respiratory: Clear to auscultation with no wheezes or rales. Good inspiratory effort. . Abdomen: Soft nontender nondistended with normal bowel sounds x4 quadrants. . Musculoskeletal: No lower  extremity edema. 2/4 pulses in all 4 extremities. Marland Kitchen Psychiatry: Mood is appropriate for condition and setting  Discharge Instructions You were cared for by a hospitalist during your hospital stay. If you have any questions about your discharge medications or the care you received while you were in the hospital after you are discharged, you can call the unit and asked to speak with the hospitalist on call if the hospitalist that took care of you is not available. Once you are discharged, your primary care physician will handle any further medical issues. Please note that NO REFILLS for any discharge medications will be authorized once you are discharged, as it is imperative that you return to your primary care physician (or establish a relationship with a primary care physician if you do not have one) for your aftercare needs so that they can reassess your need for medications and monitor your lab values.   Allergies as of 07/16/2020      Reactions   Penicillins Rash   Arms and chest. Has tolerated amoxicillin and Augmentin many times since then without difficulty.   Sulfa Antibiotics Other (See Comments)   Behavior change.  Became agitated and angry.      Medication List    STOP taking these medications   Advil 200 MG tablet Generic drug: ibuprofen   chlorpheniramine-HYDROcodone 10-8 MG/5ML Suer Commonly known as: Tussionex Pennkinetic ER   estradiol 0.05 mg/24hr patch Commonly known as: CLIMARA - Dosed in mg/24 hr   ibuprofen 800 MG tablet Commonly known as: ADVIL   predniSONE 20 MG tablet Commonly known as: DELTASONE     TAKE these medications   albuterol 108 (90 Base) MCG/ACT inhaler Commonly known as: VENTOLIN HFA Inhale 2 puffs into the lungs every 6 (six) hours as needed for wheezing or shortness of breath.   Apixaban Starter Pack (10mg  and 5mg ) Commonly known as: ELIQUIS STARTER PACK Take as directed on package: start with two-5mg  tablets twice daily for 7 days. On day  8, switch to one-5mg  tablet twice daily.   loratadine 10 MG tablet Commonly known as: CLARITIN Take 10 mg by mouth daily.   Norethindrone Acetate-Ethinyl Estrad-FE 1-20 MG-MCG(24) tablet Commonly known as: LOESTRIN 24 FE Take 1 tablet by mouth daily.      Allergies  Allergen Reactions  . Penicillins Rash    Arms and chest. Has tolerated amoxicillin and Augmentin many times since then without difficulty.  . Sulfa Antibiotics Other (See Comments)    Behavior change.  Became agitated and angry.    Follow-up Information    [facility] Follow up.   Contact information: [address] [place] [phone]               The results of significant diagnostics from this hospitalization (including imaging, microbiology, ancillary and laboratory) are listed below for reference.    Significant Diagnostic Studies: DG Chest 2 View  Result Date: 07/13/2020 CLINICAL DATA:  Tachycardia, shortness of breath EXAM: CHEST - 2 VIEW COMPARISON:  None. FINDINGS: Low lung volumes with vascular crowding. Some hazy opacity in  the right lung base adjacent the asymmetrically elevated right hemidiaphragm is favored to reflect atelectasis without other focal opacity, convincing features of edema, pneumothorax or effusion. The cardiomediastinal contours are unremarkable. Telemetry leads overlie the chest. No acute osseous or soft tissue abnormality. IMPRESSION: Low lung volumes with vascular crowding and likely right basilar atelectasis. Electronically Signed   By: Kreg Shropshire M.D.   On: 07/13/2020 00:41   CT Angio Chest PE W/Cm &/Or Wo Cm  Result Date: 07/13/2020 CLINICAL DATA:  Cough, recently broke foot with increasing exertion, PE suspected, suspected asthma attack but without relief with inhaler. EXAM: CT ANGIOGRAPHY CHEST WITH CONTRAST TECHNIQUE: Multidetector CT imaging of the chest was performed using the standard protocol during  bolus administration of intravenous contrast. Multiplanar CT image reconstructions and MIPs were obtained to evaluate the vascular anatomy. CONTRAST:  OMNIPAQUE IOHEXOL 350 MG/ML SOLN COMPARISON:  Radiograph 07/13/2020 FINDINGS: Cardiovascular: Satisfactory opacification of the pulmonary arteries with evaluation slightly limited by some some respiratory motion artifact which more pronounced towards the lung bases. There is however visible saddle pulmonary embolus extending between the left and right main stem bronchus and into the lobar and segmental branches of the right upper, middle and both lower lobes as well as into the lingular segmental branches as well. Flattening of the intraventricular septum with elevation of the RV/LV ratio to 1.15. Slight reflux of contrast into the IVC as well. Cardiac size is otherwise within normal limits. The aortic root is suboptimally assessed given cardiac pulsation artifact. The aorta is normal caliber. No acute luminal abnormality of the imaged aorta. No periaortic stranding or hemorrhage. Mediastinum/Nodes: Wedge-shaped soft tissue seen on in the anterior mediastinum with some fatty stippling. No mediastinal fluid or gas. Normal thyroid gland and thoracic inlet. No acute abnormality of the trache the ankle thanks in native distal DA given the colon anise nasally a or esophagus. No worrisome mediastinal, hilar or axillary adenopathy. Lungs/Pleura: Evaluation lung parenchyma limited by extensive respiratory motion artifact. No consolidation, features of edema, pneumothorax, or effusion. No suspicious pulmonary nodules or masses. Upper Abdomen: No acute abnormalities present in the visualized portions of the upper abdomen. Diffuse hepatic hypoattenuation compatible with hepatic steatosis. Sparing along the gallbladder fossa. Musculoskeletal: No acute osseous abnormality or suspicious osseous lesions. There is a ovoid 1.8 cm mass in the lower outer quadrant of the right  breast (4/65). No other worrisome or conspicuous chest wall lesions are evident. Review of the MIP images confirms the above findings. IMPRESSION: 1. Saddle pulmonary embolus extending between the left and right main stem bronchus and into the lobar and segmental branches of the right upper, middle and both lower lobes as well as into the lingular segmental branches as well. Positive for acute PE with CT evidence of right heart strain (RV/LV Ratio 1.15) consistent with at least submassive (intermediate risk) PE. The presence of right heart strain has been associated with an increased risk of morbidity and mortality. 2. Ovoid 1.8 cm mass in the lower outer quadrant of the right breast. Recommend further evaluation with mammography and ultrasound if not previously performed. 3. Wedge-shaped soft tissue in the anterior mediastinum with some fatty stippling, favored to reflect residual thymus. 4. Hepatic steatosis. Critical Value/emergent results were called by telephone at the time of interpretation on 07/13/2020 at 1:02 am to provider PA McDonald , who verbally acknowledged these results. Electronically Signed   By: Kreg Shropshire M.D.   On: 07/13/2020 01:03   ECHOCARDIOGRAM COMPLETE  Result Date: 07/13/2020  ECHOCARDIOGRAM REPORT   Patient Name:   Joy Boyer Date of Exam: 07/13/2020 Medical Rec #:  [id number]    Height:       62.0 in Accession #:    [id number]   Weight:       223.1 lb Date of Birth:  1986-01-09    BSA:          2.003 m Patient Age:    34 years     BP:           167/121 mmHg Patient Gender: F            HR:           106 bpm. Exam Location:  Inpatient Procedure: 2D Echo and Intracardiac Opacification Agent Indications:    Pulmonary Embolus 415.19 / I26.99  History:        Patient has no prior history of Echocardiogram examinations.  Sonographer:    Leta Jungling RDCS Referring Phys: [id number] [facility] KHAN IMPRESSIONS  1. Left ventricular ejection fraction, by estimation, is 60 to 65%. The left  ventricle has normal function. The left ventricle has no regional wall motion abnormalities. Left ventricular diastolic parameters are consistent with Grade I diastolic dysfunction (impaired relaxation).  2. Right ventricular systolic function is normal. The right ventricular size is normal. There is normal pulmonary artery systolic pressure.  3. The mitral valve was not well visualized. No evidence of mitral valve regurgitation. No evidence of mitral stenosis.  4. The aortic valve has an indeterminant number of cusps. Aortic valve regurgitation is not visualized. No aortic stenosis is present. FINDINGS  Left Ventricle: Left ventricular ejection fraction, by estimation, is 60 to 65%. The left ventricle has normal function. The left ventricle has no regional wall motion abnormalities. Definity contrast agent was given IV to delineate the left ventricular  endocardial borders. The left ventricular internal cavity size was normal in size. There is no left ventricular hypertrophy. Left ventricular diastolic parameters are consistent with Grade I diastolic dysfunction (impaired relaxation). Normal left ventricular filling pressure. Right Ventricle: The right ventricular size is normal. No increase in right ventricular wall thickness. Right ventricular systolic function is normal. There is normal pulmonary artery systolic pressure. The tricuspid regurgitant velocity is 2.20 m/s, and  with an assumed right atrial pressure of 3 mmHg, the estimated right ventricular systolic pressure is 22.4 mmHg. Left Atrium: Left atrial size was normal in size. Right Atrium: Right atrial size was normal in size. Pericardium: There is no evidence of pericardial effusion. Mitral Valve: The mitral valve was not well visualized. No evidence of mitral valve regurgitation. No evidence of mitral valve stenosis. Tricuspid Valve: The tricuspid valve is normal in structure. Tricuspid valve regurgitation is mild . No evidence of tricuspid stenosis.  Aortic Valve: The aortic valve has an indeterminant number of cusps. Aortic valve regurgitation is not visualized. No aortic stenosis is present. Aortic valve mean gradient measures 3.6 mmHg. Aortic valve peak gradient measures 6.6 mmHg. Aortic valve area, by VTI measures 2.45 cm. Pulmonic Valve: The pulmonic valve was not well visualized. Pulmonic valve regurgitation is not visualized. No evidence of pulmonic stenosis. Aorta: The aortic root is normal in size and structure. Pulmonary Artery: Indeterminant PASP, IVC poorly visualized. Venous: The inferior vena cava was not well visualized. IAS/Shunts: The interatrial septum was not well visualized.  LEFT VENTRICLE PLAX 2D LVIDd:         3.71 cm     Diastology LVIDs:  2.45 cm     LV e' medial:    10.40 cm/s LV PW:         1.05 cm     LV E/e' medial:  5.7 LV IVS:        0.94 cm     LV e' lateral:   10.30 cm/s LVOT diam:     1.90 cm     LV E/e' lateral: 5.8 LV SV:         46 LV SV Index:   23 LVOT Area:     2.84 cm  LV Volumes (MOD) LV vol d, MOD A2C: 79.7 ml LV vol d, MOD A4C: 76.6 ml LV vol s, MOD A2C: 30.1 ml LV vol s, MOD A4C: 28.4 ml LV SV MOD A2C:     49.6 ml LV SV MOD A4C:     76.6 ml LV SV MOD BP:      49.7 ml LEFT ATRIUM             Index LA diam:        2.20 cm 1.10 cm/m LA Vol (A2C):   16.6 ml 8.29 ml/m LA Vol (A4C):   22.9 ml 11.44 ml/m LA Biplane Vol: 19.7 ml 9.84 ml/m  AORTIC VALVE AV Area (Vmax):    2.75 cm AV Area (Vmean):   2.34 cm AV Area (VTI):     2.45 cm AV Vmax:           127.99 cm/s AV Vmean:          90.095 cm/s AV VTI:            0.187 m AV Peak Grad:      6.6 mmHg AV Mean Grad:      3.6 mmHg LVOT Vmax:         124.24 cm/s LVOT Vmean:        74.361 cm/s LVOT VTI:          0.161 m LVOT/AV VTI ratio: 0.86  AORTA Ao Root diam: 2.80 cm MITRAL VALVE               TRICUSPID VALVE MV Area (PHT): 8.92 cm    TR Peak grad:   19.4 mmHg MV Decel Time: 85 msec     TR Vmax:        220.00 cm/s MV E velocity: 59.70 cm/s MV A velocity: 77.10  cm/s  SHUNTS MV E/A ratio:  0.77        Systemic VTI:  0.16 m                            Systemic Diam: 1.90 cm Dina Rich MD Electronically signed by Dina Rich MD Signature Date/Time: 07/13/2020/12:32:26 PM    Final    VAS [place] LOWER EXTREMITY VENOUS (DVT)  Result Date: 07/13/2020  Lower Venous DVT Study Indications: Saddle pulmonary embolism. Left fifth metatarsal fracture x3 weeks with left calf cramping approximately 3 weeks ago. Non-weight bearing precautions on LLE.  Comparison Study: No prior study Performing Technologist: Gertie Fey MHA, RDMS, RVT, RDCS  Examination Guidelines: A complete evaluation includes B-mode imaging, spectral Doppler, color Doppler, and power Doppler as needed of all accessible portions of each vessel. Bilateral testing is considered an integral part of a complete examination. Limited examinations for reoccurring indications may be performed as noted. The reflux portion of the exam is performed with the patient in reverse Trendelenburg.  +---------+---------------+---------+-----------+----------+--------------+ RIGHT  CompressibilityPhasicitySpontaneityPropertiesThrombus Aging +---------+---------------+---------+-----------+----------+--------------+ CFV      Full           No       Yes                                 +---------+---------------+---------+-----------+----------+--------------+ SFJ      Full                                                        +---------+---------------+---------+-----------+----------+--------------+ FV Prox  Full                                                        +---------+---------------+---------+-----------+----------+--------------+ FV Mid   Full                                                        +---------+---------------+---------+-----------+----------+--------------+ FV DistalFull                                                         +---------+---------------+---------+-----------+----------+--------------+ PFV      Full                                                        +---------+---------------+---------+-----------+----------+--------------+ POP      Full           No       Yes                                 +---------+---------------+---------+-----------+----------+--------------+ PTV      Full                                                        +---------+---------------+---------+-----------+----------+--------------+ PERO     Full                                                        +---------+---------------+---------+-----------+----------+--------------+   +---------+---------------+---------+-----------+----------+--------------+ LEFT     CompressibilityPhasicitySpontaneityPropertiesThrombus Aging +---------+---------------+---------+-----------+----------+--------------+ CFV      Full           No       Yes                                 +---------+---------------+---------+-----------+----------+--------------+  SFJ      Full                                                        +---------+---------------+---------+-----------+----------+--------------+ FV Prox  Full                                                        +---------+---------------+---------+-----------+----------+--------------+ FV Mid   Full                                                        +---------+---------------+---------+-----------+----------+--------------+ FV DistalFull                                                        +---------+---------------+---------+-----------+----------+--------------+ PFV      Full                                                        +---------+---------------+---------+-----------+----------+--------------+ POP      Full           No       Yes                                  +---------+---------------+---------+-----------+----------+--------------+ PTV      None                    No                   Acute          +---------+---------------+---------+-----------+----------+--------------+ PERO     None                    No                   Acute          +---------+---------------+---------+-----------+----------+--------------+     Summary: RIGHT: - There is no evidence of deep vein thrombosis in the lower extremity.  - No cystic structure found in the popliteal fossa.  LEFT: - Findings consistent with acute deep vein thrombosis involving the left posterior tibial veins, and left peroneal veins. - No cystic structure found in the popliteal fossa.  Bilateral lower extremity venous flow is pulsatile, suggestive of possibly elevated right heart pressure.  *See table(s) above for measurements and observations. Electronically signed by Lemar Livings MD on 07/13/2020 at 8:37:42 PM.    Final     Microbiology: Recent Results (from the past 240 hour(s))  Respiratory Panel by RT PCR (Flu A&B, Covid) - Nasopharyngeal Swab     Status:  None   Collection Time: 07/13/20 12:07 AM   Specimen: Nasopharyngeal Swab  Result Value Ref Range Status   SARS Coronavirus 2 by RT PCR NEGATIVE NEGATIVE Final    Comment: (NOTE) SARS-CoV-2 target nucleic acids are NOT DETECTED.  The SARS-CoV-2 RNA is generally detectable in upper respiratoy specimens during the acute phase of infection. The lowest concentration of SARS-CoV-2 viral copies this assay can detect is 131 copies/mL. A negative result does not preclude SARS-Cov-2 infection and should not be used as the sole basis for treatment or other patient management decisions. A negative result may occur with  improper specimen collection/handling, submission of specimen other than nasopharyngeal swab, presence of viral mutation(s) within the areas targeted by this assay, and inadequate number of viral copies (<131 copies/mL). A  negative result must be combined with clinical observations, patient history, and epidemiological information. The expected result is Negative.  Fact Sheet for Patients:  https://www.moore.com/https://www.fda.gov/media/142436/download  Fact Sheet for Healthcare Providers:  https://www.young.biz/https://www.fda.gov/media/142435/download  This test is no t yet approved or cleared by the [place] FDA and  has been authorized for detection and/or diagnosis of SARS-CoV-2 by FDA under an Emergency Use Authorization (EUA). This EUA will remain  in effect (meaning this test can be used) for the duration of the COVID-19 declaration under Section 564(b)(1) of the Act, 21 U.S.C. section 360bbb-3(b)(1), unless the authorization is terminated or revoked sooner.     Influenza A by PCR NEGATIVE NEGATIVE Final   Influenza B by PCR NEGATIVE NEGATIVE Final    Comment: (NOTE) The Xpert Xpress SARS-CoV-2/FLU/RSV assay is intended as an aid in  the diagnosis of influenza from Nasopharyngeal swab specimens and  should not be used as a sole basis for treatment. Nasal washings and  aspirates are unacceptable for Xpert Xpress SARS-CoV-2/FLU/RSV  testing.  Fact Sheet for Patients: https://www.moore.com/https://www.fda.gov/media/142436/download  Fact Sheet for Healthcare Providers: https://www.young.biz/https://www.fda.gov/media/142435/download  This test is not yet approved or cleared by the [place] FDA and  has been authorized for detection and/or diagnosis of SARS-CoV-2 by  FDA under an Emergency Use Authorization (EUA). This EUA will remain  in effect (meaning this test can be used) for the duration of the  Covid-19 declaration under Section 564(b)(1) of the Act, 21  U.S.C. section 360bbb-3(b)(1), unless the authorization is  terminated or revoked. Performed at [facility], [address] [place], [place] [address]   MRSA PCR Screening     Status: None   Collection Time: 07/13/20  5:19 AM   Specimen: Nasopharyngeal  Result Value Ref Range Status     MRSA by PCR NEGATIVE NEGATIVE Final    Comment:        The GeneXpert MRSA Assay (FDA approved for NASAL specimens only), is one component of a comprehensive MRSA colonization surveillance program. It is not intended to diagnose MRSA infection nor to guide or monitor treatment for MRSA infections. Performed at [facility], [address] [place], [place] [address]      Labs: Basic Metabolic Panel: Recent Labs  Lab 07/13/20 0036 07/13/20 0822 07/14/20 0030  NA 143 138 139  K 3.9 4.2 3.6  CL 106 107 107  CO2  --  16* 20*  GLUCOSE 130* 118* 117*  BUN 13 10 14   CREATININE 0.60 0.67 0.71  CALCIUM  --  9.4 9.1   Liver Function Tests: Recent Labs  Lab 07/13/20 0822 07/14/20 0030  AST 24 22  ALT 21 20  ALKPHOS 59 59  BILITOT  0.5 0.5  PROT 8.4* 7.9  ALBUMIN 3.9 3.8   No results for input(s): LIPASE, AMYLASE in the last 168 hours. No results for input(s): AMMONIA in the last 168 hours. CBC: Recent Labs  Lab 07/13/20 0005 07/13/20 0005 07/13/20 0036 07/13/20 1610 07/14/20 0030 07/15/20 0614 07/16/20 0537  WBC 11.9*  --   --  11.2* 12.0* 11.1* 11.6*  NEUTROABS 6.7  --   --   --   --   --   --   HGB 15.0   < > 15.3* 14.9 13.9 13.8 13.3  HCT 46.3*   < > 45.0 47.6* 42.7 43.2 41.7  MCV 82.7  --   --  85.8 82.0 83.7 84.4  PLT 353  --   --  345 339 298 331   < > = values in this interval not displayed.   Cardiac Enzymes: No results for input(s): CKTOTAL, CKMB, CKMBINDEX, TROPONINI in the last 168 hours. BNP: BNP (last 3 results) No results for input(s): BNP in the last 8760 hours.  ProBNP (last 3 results) No results for input(s): PROBNP in the last 8760 hours.  CBG: No results for input(s): GLUCAP in the last 168 hours.     Signed:  Darlin Drop, MD [facility] 07/16/2020, 10:59 AM

## 2020-07-16 NOTE — TOC Transition Note (Signed)
Transition of Care [facility]) - CM/SW Discharge Note   Patient Details  Name: Joy Boyer MRN: [id number] Date of Birth: October 12, 1985  [facility] CM/SW Contact:  Lanier Clam, RN Phone Number: 07/16/2020, 10:28 AM   Clinical Narrative: Spoke to patient about d/c plans-d/c plan home.recent L ankle fx-NWB for an additional 2 weeks, has w/c,rw,crutches. No CM needs.        Barriers to Discharge: Barriers Unresolved (comment) (iv heparin and saddle P.E.)   Patient Goals and CMS Choice Patient states their goals for this hospitalization and ongoing recovery are:: to go home CMS Medicare.gov Compare Post Acute Care list provided to:: Patient Choice offered to / list presented to : Patient  Discharge Placement                       Discharge Plan and Services   Discharge Planning Services: CM Consult                                 Social Determinants of Health (SDOH) Interventions     Readmission Risk Interventions No flowsheet data found.

## 2020-07-16 NOTE — Progress Notes (Signed)
Patient discharged home, IVs removed  - WNL.  Reviewed AVS and medications with patient - emphasized importance of monitoring for bleeding while on eliquis.  Patient verbalizes understanding,  Instructed to follow up with PCP.  Patient has no questions at this time.  Patient assisted off unit to private vehicle via WC in NAD.

## 2020-07-16 NOTE — Evaluation (Signed)
Physical Therapy One Time Evaluation Patient Details Name: Joy Boyer MRN: [id number] DOB: 08-17-1986 Today's Date: 07/16/2020   History of Present Illness  34 y.o. female with medical history significant of asthma and recent fracture of her right foot presented to ED for evaluation of worsening shortness of breath. Patient states that she was recently had a fracture of her left foot and she is using wheelchair, walker and crutches for the last 3 weeks.  Pt found to have acute saddle pulmonary embolism  Clinical Impression  Patient evaluated by Physical Therapy with no further acute PT needs identified. All education has been completed and the patient has no further questions.  Pt reports she has been doing well maintaining NWB prior to admission.  Pt has been staying at her mother's home due to handicap accessibility.  Pt typically uses RW for stability. SpO2 99% on room air after ambulating, and pt reports mild dyspnea (HR 129 bpm).  Pt encouraged to perform activity in small bouts with rest breaks. See below for any follow-up Physical Therapy or equipment needs. PT is signing off. Thank you for this referral.     Follow Up Recommendations No PT follow up    Equipment Recommendations  None recommended by PT    Recommendations for Other Services       Precautions / Restrictions Precautions Precautions: Fall Restrictions Weight Bearing Restrictions: Yes LLE Weight Bearing: Non weight bearing      Mobility  Bed Mobility Overal bed mobility: Independent                  Transfers Overall transfer level: Independent                  Ambulation/Gait Ambulation/Gait assistance: Modified independent (Device/Increase time) Gait Distance (Feet): 50 Feet Assistive device: Rolling walker (2 wheeled)       General Gait Details: pt maintains Left LE NWB well, using RW appropriately  Stairs            Wheelchair Mobility    Modified Rankin (Stroke Patients  Only)       Balance                                             Pertinent Vitals/Pain Pain Assessment: No/denies pain    Home Living Family/patient expects to be discharged to:: Private residence Living Arrangements: Parent   Type of Home: House Home Access: Stairs to enter   Secretary/administrator of Steps: 1 Home Layout: One level Home Equipment: Environmental consultant - 2 wheels;Bedside commode;Transport chair;Crutches Additional Comments: staying with her mother at this time (as above)    Prior Function Level of Independence: Independent with assistive device(s)         Comments: using RW mostly for Left LE NWB due to foot fx, does have transport chair if needed     Hand Dominance        Extremity/Trunk Assessment   Upper Extremity Assessment Upper Extremity Assessment: Overall WFL for tasks assessed    Lower Extremity Assessment Lower Extremity Assessment: Overall WFL for tasks assessed;LLE deficits/detail LLE Deficits / Details: left ankle in boot due to foot fx       Communication   Communication: No difficulties  Cognition Arousal/Alertness: Awake/alert Behavior During Therapy: WFL for tasks assessed/performed Overall Cognitive Status: Within Functional Limits for tasks assessed  General Comments      Exercises     Assessment/Plan    PT Assessment Patent does not need any further PT services  PT Problem List         PT Treatment Interventions      PT Goals (Current goals can be found in the Care Plan section)  Acute Rehab PT Goals PT Goal Formulation: All assessment and education complete, DC therapy    Frequency     Barriers to discharge        Co-evaluation               AM-PAC PT "6 Clicks" Mobility  Outcome Measure Help needed turning from your back to your side while in a flat bed without using bedrails?: None Help needed moving from lying on your back to  sitting on the side of a flat bed without using bedrails?: None Help needed moving to and from a bed to a chair (including a wheelchair)?: None Help needed standing up from a chair using your arms (e.g., wheelchair or bedside chair)?: None Help needed to walk in hospital room?: None Help needed climbing 3-5 steps with a railing? : A Little 6 Click Score: 23    End of Session   Activity Tolerance: Patient tolerated treatment well Patient left: in bed;with call bell/phone within reach;with family/visitor present Nurse Communication: Mobility status PT Visit Diagnosis: Difficulty in walking, not elsewhere classified (R26.2)    Time: 2263-3354 PT Time Calculation (min) (ACUTE ONLY): 11 min   Charges:   PT Evaluation $PT Eval Low Complexity: 1 Low        Kati PT, DPT Acute Rehabilitation Services Pager: [phone] Office: [phone]  Maida Sale E 07/16/2020, 12:16 PM

## 2021-08-25 IMAGING — CT CT ANGIO CHEST
2 of 6 series · 17 of 36 positions shown · IV contrast (omnipaque)
Comparison: Radiograph 07/13/2020

CLINICAL DATA: Cough, recently broke foot with increasing exertion,
PE suspected, suspected asthma attack but without relief with
inhaler.

EXAM:
CT ANGIOGRAPHY CHEST WITH CONTRAST
TECHNIQUE: Multidetector CT imaging of the chest was performed using the
standard protocol during bolus administration of intravenous
contrast. Multiplanar CT image reconstructions and MIPs were
obtained to evaluate the vascular anatomy.
CONTRAST:  100mL OMNIPAQUE IOHEXOL 350 MG/ML SOLN

[Series 5: thins · axial · 0.80mm/px · z∈[-296,-83]mm · 16 of 241 slices shown]
[im 14/241  lung]
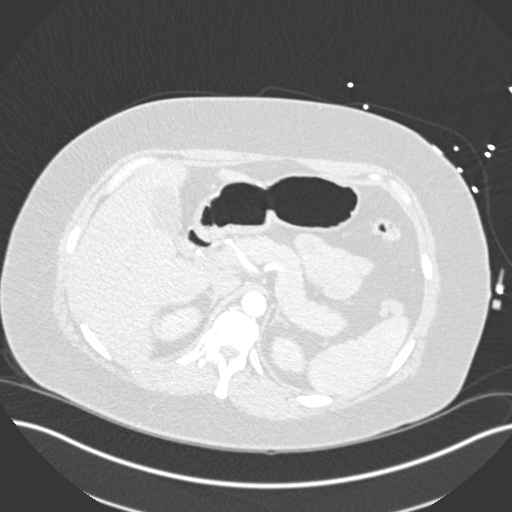
[im 27/241  mediastinal]
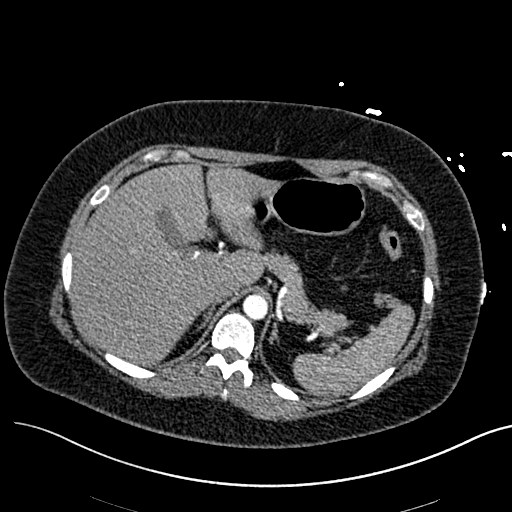
[im 41/241  lung]
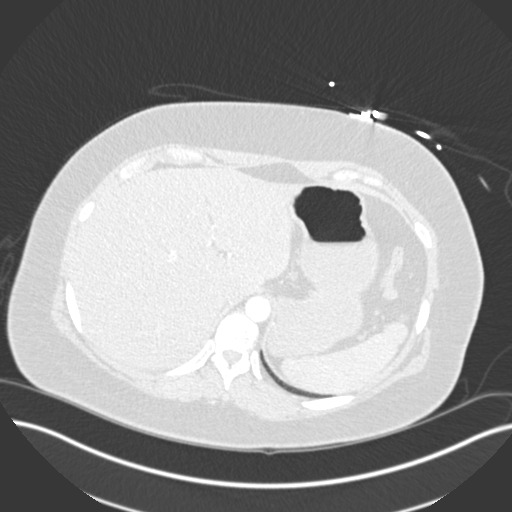
[im 54/241  mediastinal]
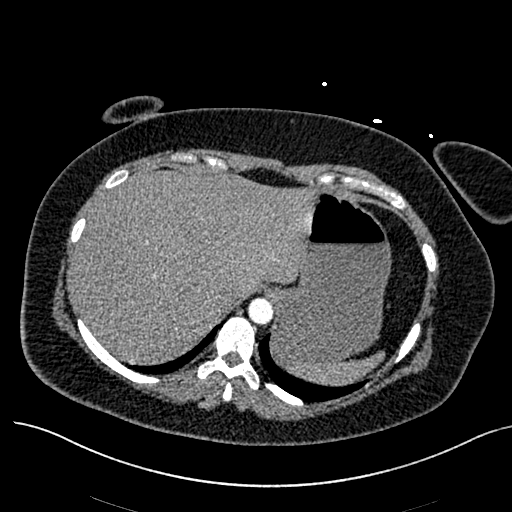
[im 67/241  lung]
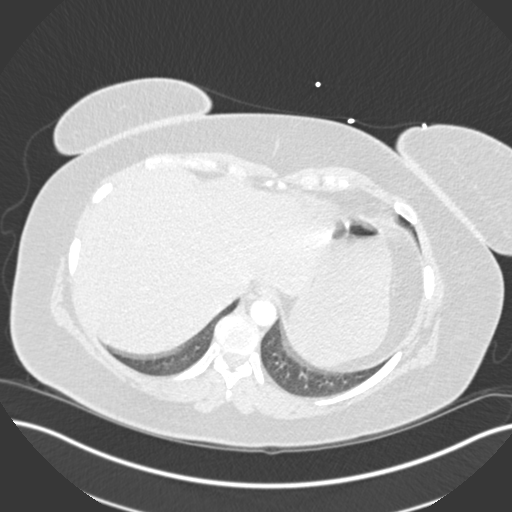
[im 81/241  mediastinal]
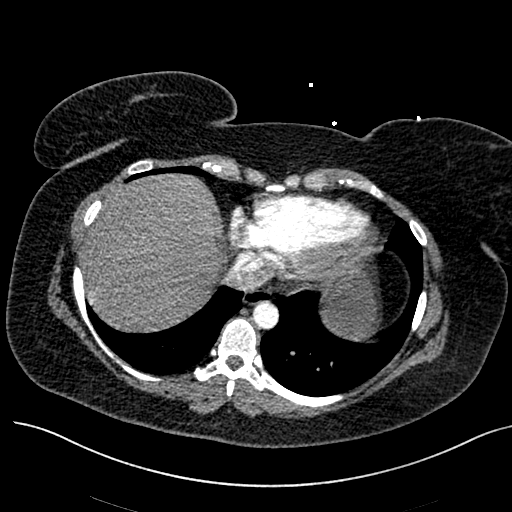
[im 94/241  lung]
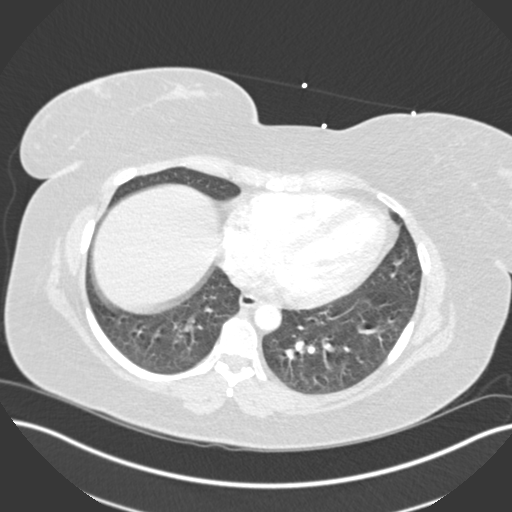
[im 107/241  mediastinal]
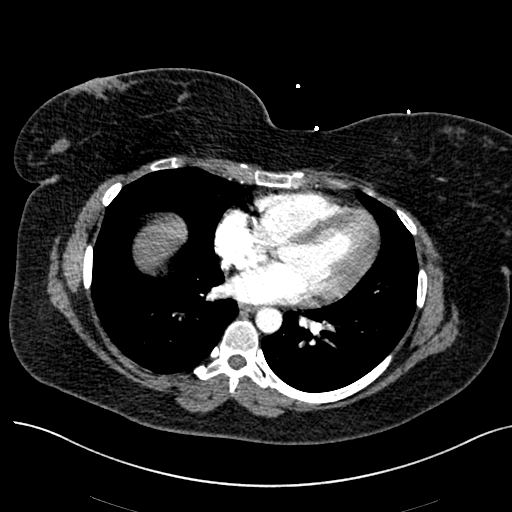
[im 134/241  lung]
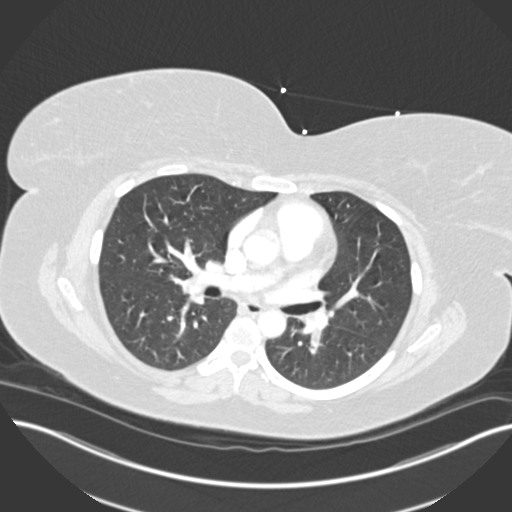
[im 147/241  mediastinal]
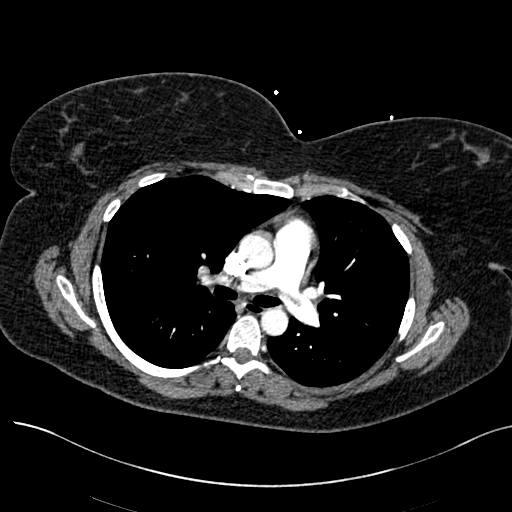
[im 161/241  lung]
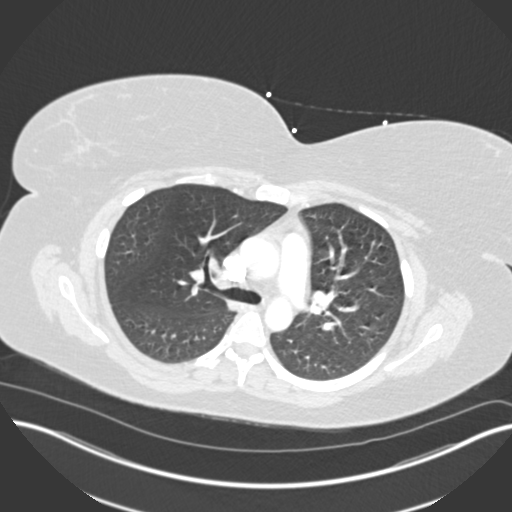
[im 174/241  mediastinal]
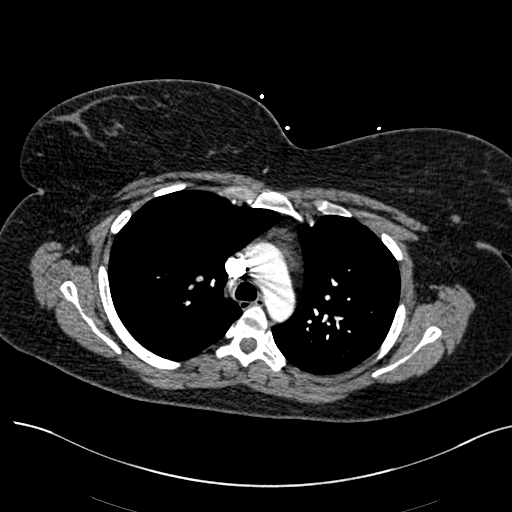
[im 187/241  lung]
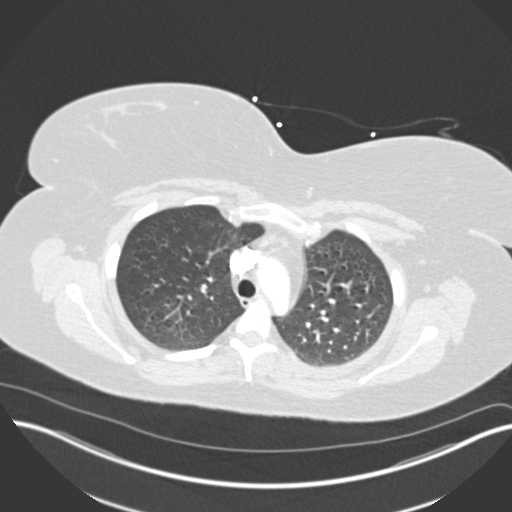
[im 201/241  mediastinal]
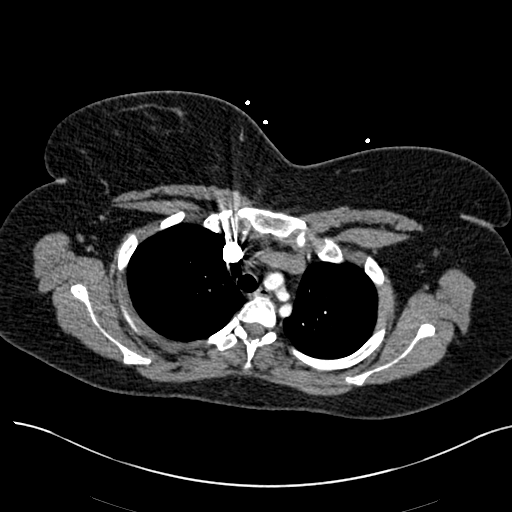
[im 214/241  lung]
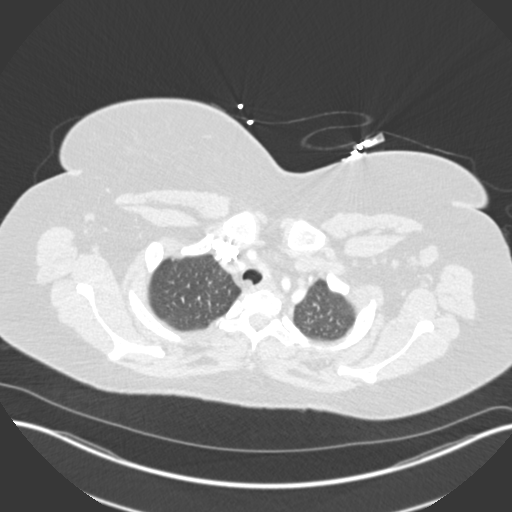
[im 227/241  mediastinal]
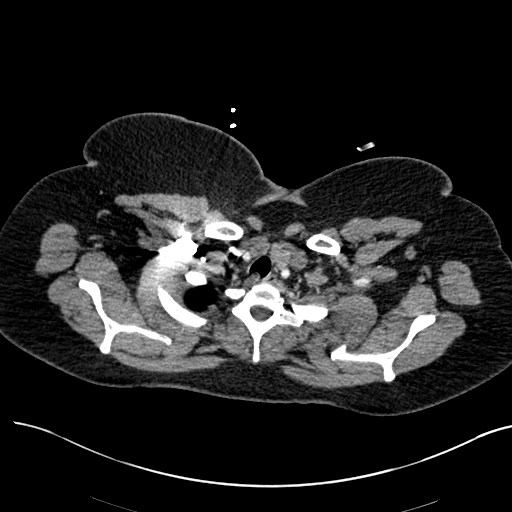

[Series 7: coronal mpr · coronal · 0.54mm/px · 1 of 151 slices shown]
[im 76/151  mediastinal]
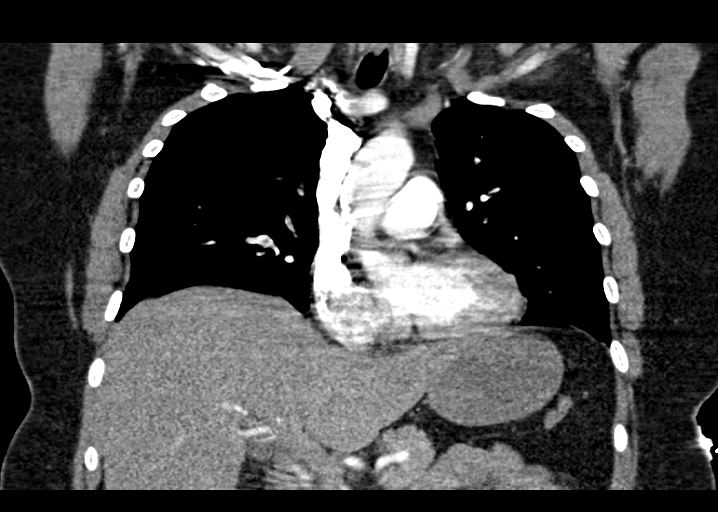

[17 of 36 positions shown; findings below may reference images not displayed]

FINDINGS: Cardiovascular: Satisfactory opacification of the pulmonary arteries
with evaluation slightly limited by some some respiratory motion
artifact which more pronounced towards the lung bases. There is
however visible saddle pulmonary embolus extending between the left
and right main stem bronchus and into the lobar and segmental
branches of the right upper, middle and both lower lobes as well as
into the lingular segmental branches as well. Flattening of the
intraventricular septum with elevation of the RV/LV ratio to 1.15.
Slight reflux of contrast into the IVC as well. Cardiac size is
otherwise within normal limits. The aortic root is suboptimally
assessed given cardiac pulsation artifact. The aorta is normal
caliber. No acute luminal abnormality of the imaged aorta. No
periaortic stranding or hemorrhage.

Mediastinum/Nodes: Wedge-shaped soft tissue seen on in the anterior
mediastinum with some fatty stippling. No mediastinal fluid or gas.
Normal thyroid gland and thoracic inlet. No acute abnormality of the
trache the ankle thanks in native distal DA given the [place]
nasally a or esophagus. No worrisome mediastinal, hilar or axillary
adenopathy.

Lungs/Pleura: Evaluation lung parenchyma limited by extensive
respiratory motion artifact. No consolidation, features of edema,
pneumothorax, or effusion. No suspicious pulmonary nodules or
masses.

Upper Abdomen: No acute abnormalities present in the visualized
portions of the upper abdomen. Diffuse hepatic hypoattenuation
compatible with hepatic steatosis. Sparing along the gallbladder
fossa.

Musculoskeletal: No acute osseous abnormality or suspicious osseous
lesions. There is a ovoid 1.8 cm mass in the lower outer quadrant of
the right breast (4/65). No other worrisome or conspicuous chest
wall lesions are evident.

Review of the MIP images confirms the above findings.
IMPRESSION: 1. Saddle pulmonary embolus extending between the left and right
main stem bronchus and into the lobar and segmental branches of the
right upper, middle and both lower lobes as well as into the
lingular segmental branches as well. Positive for acute PE with CT
evidence of right heart strain (RV/LV Ratio 1.15) consistent with at
least submassive (intermediate risk) PE. The presence of right heart
strain has been associated with an increased risk of morbidity and
mortality.
2. Ovoid 1.8 cm mass in the lower outer quadrant of the right
breast. Recommend further evaluation with mammography and ultrasound
if not previously performed.
3. Wedge-shaped soft tissue in the anterior mediastinum with some
fatty stippling, favored to reflect residual thymus.
4. Hepatic steatosis.

Critical Value/emergent results were called by telephone at the time
of interpretation on 07/13/2020 at [DATE] to provider PA Dobrovolskis ,
who verbally acknowledged these results.

## 2021-08-25 IMAGING — CR DG CHEST 2V
2 series · 2 of 2 positions shown · non-contrast
Comparison: None.

CLINICAL DATA: Tachycardia, shortness of breath

EXAM:
CHEST - 2 VIEW

[w chest lat]
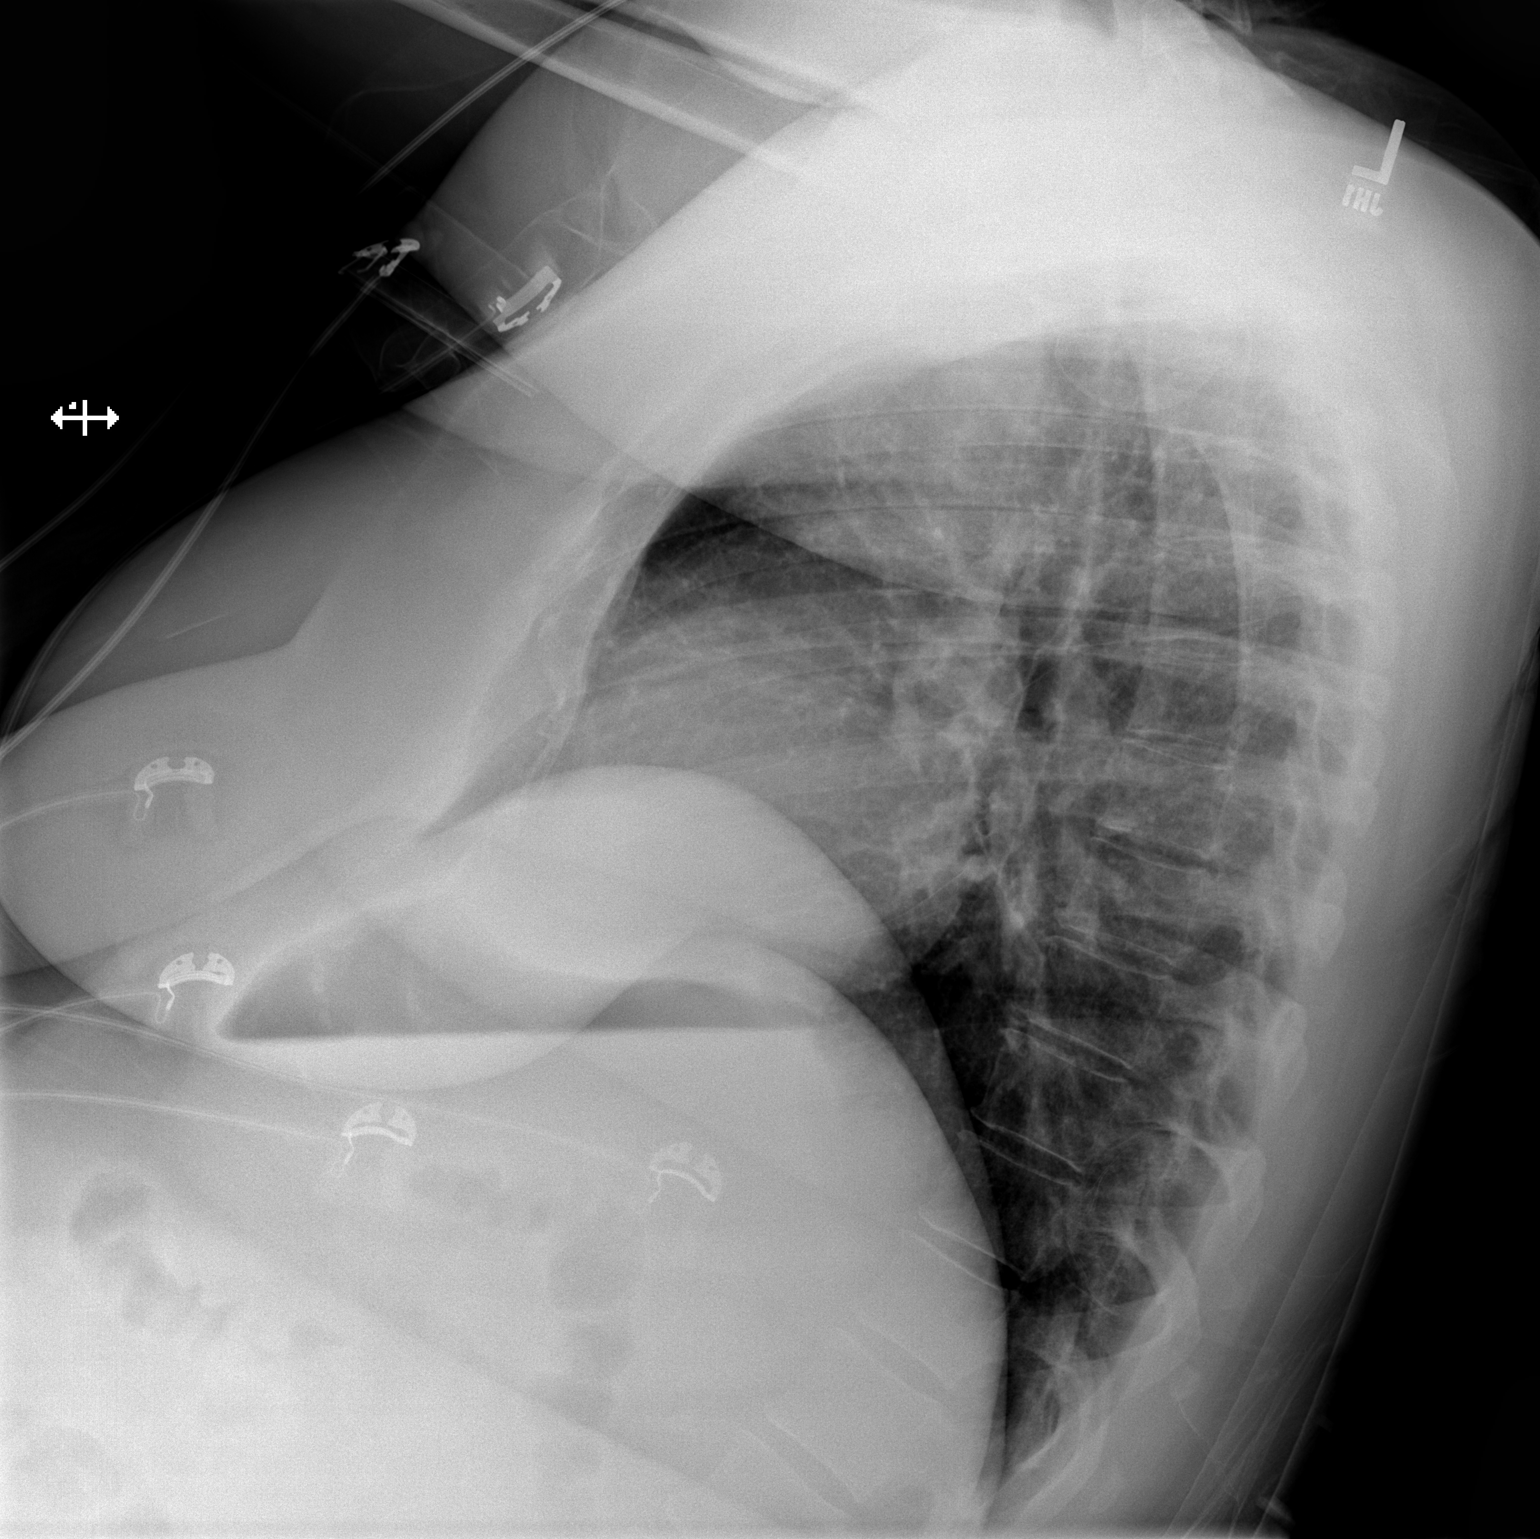

[x chest ap]
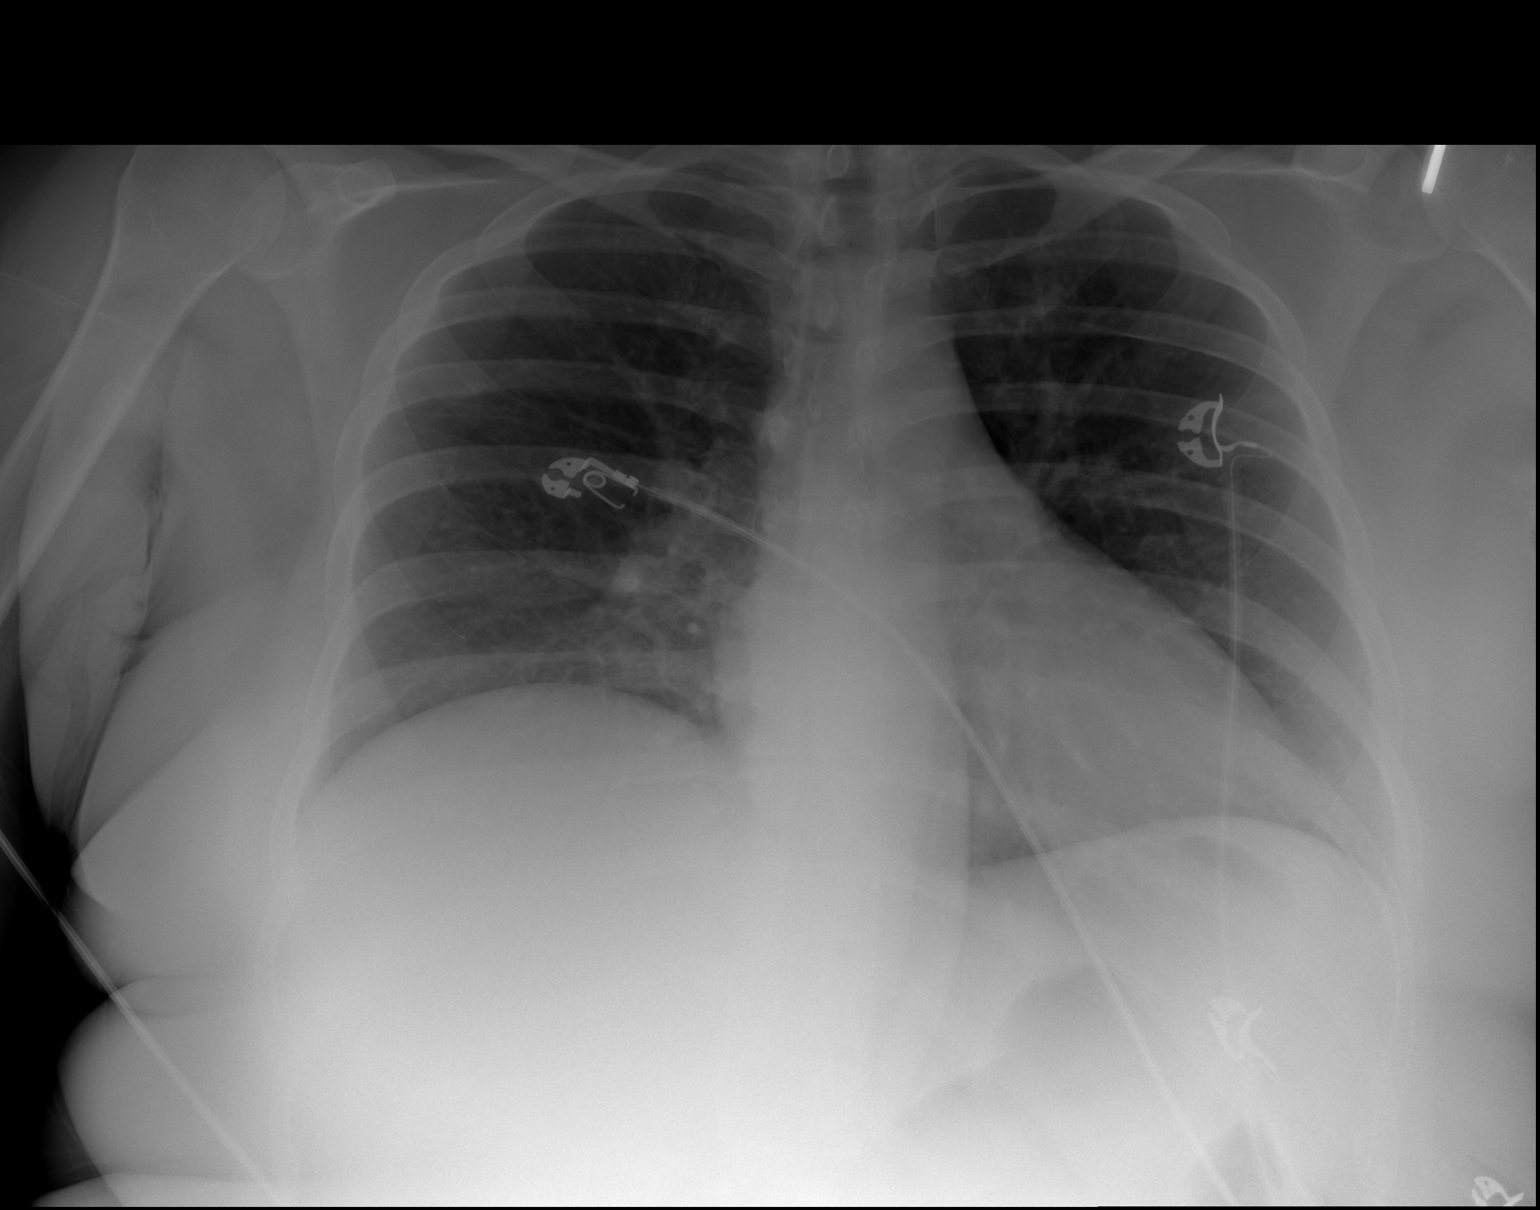

[2 of 2 positions shown; findings below may reference images not displayed]

FINDINGS: Low lung volumes with vascular crowding. Some hazy opacity in the
right lung base adjacent the asymmetrically elevated right
hemidiaphragm is favored to reflect atelectasis without other focal
opacity, convincing features of edema, pneumothorax or effusion. The
cardiomediastinal contours are unremarkable. Telemetry leads overlie
the chest. No acute osseous or soft tissue abnormality.
IMPRESSION: Low lung volumes with vascular crowding and likely right basilar
atelectasis.
